# Patient Record
Sex: Male | Born: 1955 | Race: White | Hispanic: No | Marital: Married | Smoking: Former smoker
Health system: Southern US, Community
[De-identification: ages and names within clinical notes are randomized; demographics above are authoritative.]

## PROBLEM LIST (undated history)

## (undated) DIAGNOSIS — I10 Essential (primary) hypertension: Secondary | ICD-10-CM

## (undated) DIAGNOSIS — E119 Type 2 diabetes mellitus without complications: Secondary | ICD-10-CM

## (undated) DIAGNOSIS — N400 Enlarged prostate without lower urinary tract symptoms: Secondary | ICD-10-CM

## (undated) DIAGNOSIS — J189 Pneumonia, unspecified organism: Secondary | ICD-10-CM

## (undated) DIAGNOSIS — G9050 Complex regional pain syndrome I, unspecified: Secondary | ICD-10-CM

## (undated) DIAGNOSIS — K594 Anal spasm: Secondary | ICD-10-CM

## (undated) DIAGNOSIS — G4733 Obstructive sleep apnea (adult) (pediatric): Secondary | ICD-10-CM

## (undated) DIAGNOSIS — I82401 Acute embolism and thrombosis of unspecified deep veins of right lower extremity: Secondary | ICD-10-CM

## (undated) DIAGNOSIS — G5603 Carpal tunnel syndrome, bilateral upper limbs: Secondary | ICD-10-CM

## (undated) DIAGNOSIS — G2581 Restless legs syndrome: Secondary | ICD-10-CM

## (undated) DIAGNOSIS — C61 Malignant neoplasm of prostate: Secondary | ICD-10-CM

## (undated) DIAGNOSIS — G4730 Sleep apnea, unspecified: Secondary | ICD-10-CM

## (undated) DIAGNOSIS — M1990 Unspecified osteoarthritis, unspecified site: Secondary | ICD-10-CM

## (undated) DIAGNOSIS — I82402 Acute embolism and thrombosis of unspecified deep veins of left lower extremity: Secondary | ICD-10-CM

## (undated) DIAGNOSIS — H269 Unspecified cataract: Secondary | ICD-10-CM

## (undated) DIAGNOSIS — E669 Obesity, unspecified: Secondary | ICD-10-CM

## (undated) DIAGNOSIS — I8390 Asymptomatic varicose veins of unspecified lower extremity: Secondary | ICD-10-CM

## (undated) DIAGNOSIS — K219 Gastro-esophageal reflux disease without esophagitis: Secondary | ICD-10-CM

## (undated) DIAGNOSIS — E7800 Pure hypercholesterolemia, unspecified: Secondary | ICD-10-CM

## (undated) HISTORY — PX: ELBOW ARTHROSCOPY: SUR87

## (undated) HISTORY — DX: Unspecified cataract: H26.9

## (undated) HISTORY — DX: Acute embolism and thrombosis of unspecified deep veins of right lower extremity: I82.401

## (undated) HISTORY — DX: Acute embolism and thrombosis of unspecified deep veins of left lower extremity: I82.402

## (undated) HISTORY — DX: Asymptomatic varicose veins of unspecified lower extremity: I83.90

## (undated) HISTORY — PX: NECK SURGERY: SHX720

## (undated) HISTORY — DX: Benign prostatic hyperplasia without lower urinary tract symptoms: N40.0

## (undated) HISTORY — PX: KNEE ARTHROSCOPY: SHX127

## (undated) HISTORY — PX: KNEE ARTHROPLASTY: SHX992

---
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
## 1988-05-03 HISTORY — PX: VASECTOMY: SHX75

## 1995-05-04 HISTORY — PX: ELBOW ARTHROSCOPY: SUR87

## 2000-05-03 HISTORY — PX: ANKLE FRACTURE SURGERY: SHX122

## 2004-06-16 ENCOUNTER — Ambulatory Visit: Payer: Self-pay | Admitting: Pain Medicine

## 2004-06-22 ENCOUNTER — Ambulatory Visit: Payer: Self-pay | Admitting: Pain Medicine

## 2004-07-23 ENCOUNTER — Ambulatory Visit: Payer: Self-pay | Admitting: Pain Medicine

## 2004-08-03 ENCOUNTER — Ambulatory Visit: Payer: Self-pay | Admitting: Pain Medicine

## 2004-09-03 ENCOUNTER — Ambulatory Visit: Payer: Self-pay | Admitting: Pain Medicine

## 2004-09-16 ENCOUNTER — Ambulatory Visit: Payer: Self-pay | Admitting: Pain Medicine

## 2004-10-29 ENCOUNTER — Ambulatory Visit: Payer: Self-pay | Admitting: Pain Medicine

## 2004-11-11 ENCOUNTER — Ambulatory Visit: Payer: Self-pay | Admitting: Pain Medicine

## 2004-12-22 ENCOUNTER — Ambulatory Visit: Payer: Self-pay | Admitting: Pain Medicine

## 2005-01-13 ENCOUNTER — Ambulatory Visit: Payer: Self-pay | Admitting: Pain Medicine

## 2005-02-16 ENCOUNTER — Ambulatory Visit: Payer: Self-pay | Admitting: Pain Medicine

## 2005-02-25 ENCOUNTER — Ambulatory Visit: Payer: Self-pay | Admitting: Unknown Physician Specialty

## 2005-03-11 ENCOUNTER — Ambulatory Visit: Payer: Self-pay | Admitting: Pain Medicine

## 2005-03-17 ENCOUNTER — Ambulatory Visit: Payer: Self-pay | Admitting: Pain Medicine

## 2005-05-18 ENCOUNTER — Ambulatory Visit: Payer: Self-pay | Admitting: Pain Medicine

## 2005-07-08 ENCOUNTER — Ambulatory Visit: Payer: Self-pay | Admitting: Pain Medicine

## 2005-08-10 ENCOUNTER — Ambulatory Visit: Payer: Self-pay | Admitting: Pain Medicine

## 2005-09-07 ENCOUNTER — Ambulatory Visit: Payer: Self-pay | Admitting: Pain Medicine

## 2005-10-05 ENCOUNTER — Ambulatory Visit: Payer: Self-pay | Admitting: Pain Medicine

## 2005-11-02 ENCOUNTER — Ambulatory Visit: Payer: Self-pay | Admitting: Pain Medicine

## 2005-12-02 ENCOUNTER — Ambulatory Visit: Payer: Self-pay | Admitting: Pain Medicine

## 2006-01-25 ENCOUNTER — Ambulatory Visit: Payer: Self-pay | Admitting: Pain Medicine

## 2006-04-19 ENCOUNTER — Ambulatory Visit: Payer: Self-pay | Admitting: Pain Medicine

## 2006-05-04 ENCOUNTER — Ambulatory Visit: Payer: Self-pay | Admitting: Pain Medicine

## 2006-06-09 ENCOUNTER — Ambulatory Visit: Payer: Self-pay | Admitting: Pain Medicine

## 2006-06-15 ENCOUNTER — Ambulatory Visit: Payer: Self-pay | Admitting: Pain Medicine

## 2006-07-12 ENCOUNTER — Ambulatory Visit: Payer: Self-pay | Admitting: Pain Medicine

## 2006-10-06 ENCOUNTER — Ambulatory Visit: Payer: Self-pay | Admitting: Pain Medicine

## 2006-12-29 ENCOUNTER — Ambulatory Visit: Payer: Self-pay | Admitting: Pain Medicine

## 2007-01-11 ENCOUNTER — Ambulatory Visit: Payer: Self-pay | Admitting: Pain Medicine

## 2007-02-15 ENCOUNTER — Ambulatory Visit: Payer: Self-pay | Admitting: Pain Medicine

## 2007-03-23 ENCOUNTER — Ambulatory Visit: Payer: Self-pay | Admitting: Pain Medicine

## 2007-05-03 ENCOUNTER — Other Ambulatory Visit: Payer: Self-pay

## 2007-05-03 ENCOUNTER — Ambulatory Visit: Payer: Self-pay | Admitting: Orthopaedic Surgery

## 2007-05-09 ENCOUNTER — Ambulatory Visit: Payer: Self-pay | Admitting: Orthopaedic Surgery

## 2007-06-15 ENCOUNTER — Ambulatory Visit: Payer: Self-pay | Admitting: Pain Medicine

## 2007-09-07 ENCOUNTER — Ambulatory Visit: Payer: Self-pay | Admitting: Pain Medicine

## 2007-11-30 ENCOUNTER — Ambulatory Visit: Payer: Self-pay | Admitting: Pain Medicine

## 2008-01-31 ENCOUNTER — Ambulatory Visit: Payer: Self-pay | Admitting: Gastroenterology

## 2008-02-15 ENCOUNTER — Ambulatory Visit: Payer: Self-pay | Admitting: Pain Medicine

## 2008-03-06 ENCOUNTER — Ambulatory Visit: Payer: Self-pay | Admitting: Gastroenterology

## 2008-03-06 HISTORY — PX: UPPER GASTROINTESTINAL ENDOSCOPY: SHX188

## 2008-05-16 ENCOUNTER — Ambulatory Visit: Payer: Self-pay | Admitting: Pain Medicine

## 2008-08-08 ENCOUNTER — Ambulatory Visit: Payer: Self-pay | Admitting: Pain Medicine

## 2008-08-12 ENCOUNTER — Ambulatory Visit: Payer: Self-pay | Admitting: Pain Medicine

## 2008-11-05 ENCOUNTER — Ambulatory Visit: Payer: Self-pay | Admitting: Pain Medicine

## 2009-01-01 ENCOUNTER — Ambulatory Visit: Payer: Self-pay | Admitting: Pain Medicine

## 2009-02-06 ENCOUNTER — Ambulatory Visit: Payer: Self-pay | Admitting: Pain Medicine

## 2009-02-12 ENCOUNTER — Ambulatory Visit: Payer: Self-pay | Admitting: Pain Medicine

## 2009-03-11 ENCOUNTER — Ambulatory Visit: Payer: Self-pay | Admitting: Pain Medicine

## 2009-03-19 ENCOUNTER — Ambulatory Visit: Payer: Self-pay | Admitting: Pain Medicine

## 2009-04-29 ENCOUNTER — Ambulatory Visit: Payer: Self-pay | Admitting: Pain Medicine

## 2009-05-07 ENCOUNTER — Ambulatory Visit: Payer: Self-pay | Admitting: Pain Medicine

## 2009-06-05 ENCOUNTER — Ambulatory Visit: Payer: Self-pay | Admitting: Pain Medicine

## 2009-06-11 ENCOUNTER — Ambulatory Visit: Payer: Self-pay | Admitting: Pain Medicine

## 2009-07-03 ENCOUNTER — Ambulatory Visit: Payer: Self-pay | Admitting: Pain Medicine

## 2009-10-29 ENCOUNTER — Ambulatory Visit: Payer: Self-pay

## 2011-04-02 ENCOUNTER — Ambulatory Visit: Payer: Self-pay | Admitting: Physical Medicine and Rehabilitation

## 2011-09-01 HISTORY — PX: ANTERIOR FUSION CERVICAL SPINE: SUR626

## 2012-02-02 ENCOUNTER — Ambulatory Visit: Payer: Self-pay | Admitting: Surgery

## 2012-02-02 LAB — CBC WITH DIFFERENTIAL/PLATELET
Basophil %: 0.3 %
Eosinophil #: 0.1 10*3/uL (ref 0.0–0.7)
Eosinophil %: 0.8 %
HCT: 44.7 % (ref 40.0–52.0)
HGB: 15 g/dL (ref 13.0–18.0)
Lymphocyte #: 1 10*3/uL (ref 1.0–3.6)
MCH: 30.8 pg (ref 26.0–34.0)
MCHC: 33.5 g/dL (ref 32.0–36.0)
MCV: 92 fL (ref 80–100)
Monocyte #: 0.5 x10 3/mm (ref 0.2–1.0)
Neutrophil #: 5.1 10*3/uL (ref 1.4–6.5)
Neutrophil %: 76.4 %
Platelet: 225 10*3/uL (ref 150–440)
RBC: 4.86 10*6/uL (ref 4.40–5.90)
WBC: 6.7 10*3/uL (ref 3.8–10.6)

## 2012-02-02 LAB — BASIC METABOLIC PANEL
Anion Gap: 6 — ABNORMAL LOW (ref 7–16)
BUN: 17 mg/dL (ref 7–18)
Calcium, Total: 8.9 mg/dL (ref 8.5–10.1)
Chloride: 107 mmol/L (ref 98–107)
Co2: 31 mmol/L (ref 21–32)
Creatinine: 0.8 mg/dL (ref 0.60–1.30)
EGFR (African American): >60
Osmolality: 289 (ref 275–301)
Potassium: 4.1 mmol/L (ref 3.5–5.1)

## 2012-02-09 ENCOUNTER — Ambulatory Visit: Payer: Self-pay | Admitting: Surgery

## 2012-02-11 LAB — PATHOLOGY REPORT

## 2012-05-04 ENCOUNTER — Ambulatory Visit: Payer: Self-pay | Admitting: Physical Medicine and Rehabilitation

## 2012-09-20 ENCOUNTER — Ambulatory Visit: Payer: Self-pay | Admitting: Family Medicine

## 2012-10-06 ENCOUNTER — Ambulatory Visit: Payer: Self-pay | Admitting: Family Medicine

## 2013-08-17 DIAGNOSIS — M779 Enthesopathy, unspecified: Secondary | ICD-10-CM | POA: Insufficient documentation

## 2013-08-17 DIAGNOSIS — M1990 Unspecified osteoarthritis, unspecified site: Secondary | ICD-10-CM | POA: Insufficient documentation

## 2013-08-17 DIAGNOSIS — N320 Bladder-neck obstruction: Secondary | ICD-10-CM | POA: Insufficient documentation

## 2013-08-17 DIAGNOSIS — G4700 Insomnia, unspecified: Secondary | ICD-10-CM | POA: Insufficient documentation

## 2013-08-17 DIAGNOSIS — I10 Essential (primary) hypertension: Secondary | ICD-10-CM | POA: Insufficient documentation

## 2013-08-17 DIAGNOSIS — E7800 Pure hypercholesterolemia, unspecified: Secondary | ICD-10-CM | POA: Insufficient documentation

## 2013-10-11 DIAGNOSIS — K219 Gastro-esophageal reflux disease without esophagitis: Secondary | ICD-10-CM | POA: Insufficient documentation

## 2014-01-08 ENCOUNTER — Ambulatory Visit (INDEPENDENT_AMBULATORY_CARE_PROVIDER_SITE_OTHER): Payer: BC Managed Care – PPO | Admitting: Podiatry

## 2014-01-08 ENCOUNTER — Ambulatory Visit (INDEPENDENT_AMBULATORY_CARE_PROVIDER_SITE_OTHER): Payer: BC Managed Care – PPO

## 2014-01-08 ENCOUNTER — Encounter: Payer: Self-pay | Admitting: Podiatry

## 2014-01-08 ENCOUNTER — Ambulatory Visit: Payer: Self-pay | Admitting: Podiatry

## 2014-01-08 VITALS — BP 128/79 | HR 92 | Resp 12

## 2014-01-08 DIAGNOSIS — R52 Pain, unspecified: Secondary | ICD-10-CM

## 2014-01-08 DIAGNOSIS — G5762 Lesion of plantar nerve, left lower limb: Secondary | ICD-10-CM

## 2014-01-08 DIAGNOSIS — G5760 Lesion of plantar nerve, unspecified lower limb: Secondary | ICD-10-CM

## 2014-01-08 DIAGNOSIS — M65979 Unspecified synovitis and tenosynovitis, unspecified ankle and foot: Secondary | ICD-10-CM | POA: Diagnosis not present

## 2014-01-08 DIAGNOSIS — M659 Synovitis and tenosynovitis, unspecified: Secondary | ICD-10-CM | POA: Diagnosis not present

## 2014-01-08 DIAGNOSIS — M779 Enthesopathy, unspecified: Secondary | ICD-10-CM | POA: Diagnosis not present

## 2014-01-08 MED ORDER — TRIAMCINOLONE ACETONIDE 10 MG/ML IJ SUSP: 10.0000 mg | Freq: Once | INTRAMUSCULAR | Status: DC

## 2014-01-08 NOTE — Progress Notes (Signed)
   Subjective:    Patient ID: Ruben [name], male    DOB: 1955-05-13, 58 y.o.   MRN: [id number]  HPI PT STATED TOP OF THE LT FOOT IS SWOLLEN AND SORE FOR 3 WEEKS. THE FOOT IS NOT GETTING ANY BETTER AND WORSE. THE FOOT GET AGGRAVATED BY SITTING/WALKING. TRIED ICE BUT NO HELP.   Review of Systems  All other systems reviewed and are negative.      Objective:   Physical Exam        Assessment & Plan:

## 2014-01-08 NOTE — Progress Notes (Signed)
Subjective:     Patient ID: Ruben [name], male   DOB: 03/13/1956, 58 y.o.   MRN: [id number]  HPI patient presents stating I been getting a lot of pain on top of my left foot for about 3 weeks. I've had off and on pain but not like this has been   Review of Systems     Objective:   Physical Exam Neurovascular status unchanged with patient's health history doing well. Patient is noted to have moderate discomfort around the second third and fourth metatarsal phalangeal joints left and moderate discomfort in the third interspace of the left foot upon palpation    Assessment:     Probable capsulitis with possibility for neuroma symptomatology left    Plan:     H&P and condition and x-rays discussed with patient. Today I did steroidal injection of the second and fourth MPJs 3 mg dexamethasone Kenalog 5 mg Xylocaine and did scanned for custom orthotics to reduce pressure against his feet. Reappoint when they are ready or earlier if there is any issues

## 2014-02-01 ENCOUNTER — Ambulatory Visit (INDEPENDENT_AMBULATORY_CARE_PROVIDER_SITE_OTHER): Payer: BC Managed Care – PPO | Admitting: Podiatry

## 2014-02-01 VITALS — BP 122/78 | HR 90 | Resp 16

## 2014-02-01 DIAGNOSIS — M779 Enthesopathy, unspecified: Secondary | ICD-10-CM

## 2014-02-01 NOTE — Progress Notes (Signed)
Subjective:     Patient ID: Ruben [name], male   DOB: 1955-06-14, 58 y.o.   MRN: [id number]  HPI patient presents stating I'm feeling some better but still noticing discomfort in my forefoot left. Also here to pickup orthotics   Review of Systems     Objective:   Physical Exam Neurovascular status intact with significant reduction of discomfort and swelling in the third metatarsal and fourth metatarsophalangeal joints with toes in good alignment and no significant bruising noted    Assessment:     Improved capsulitis with mild to moderate discomfort still noted    Plan:     Reviewed condition and recommended stretching exercises supportive shoes and new orthotics. If symptoms persist or recur he is to reappoint

## 2014-02-13 DIAGNOSIS — M25559 Pain in unspecified hip: Secondary | ICD-10-CM | POA: Insufficient documentation

## 2014-04-05 DIAGNOSIS — M222X9 Patellofemoral disorders, unspecified knee: Secondary | ICD-10-CM | POA: Insufficient documentation

## 2014-05-21 ENCOUNTER — Ambulatory Visit: Payer: Self-pay | Admitting: General Practice

## 2014-08-16 ENCOUNTER — Ambulatory Visit (INDEPENDENT_AMBULATORY_CARE_PROVIDER_SITE_OTHER): Payer: BLUE CROSS/BLUE SHIELD

## 2014-08-16 ENCOUNTER — Ambulatory Visit (INDEPENDENT_AMBULATORY_CARE_PROVIDER_SITE_OTHER): Payer: BLUE CROSS/BLUE SHIELD | Admitting: Podiatry

## 2014-08-16 ENCOUNTER — Encounter: Payer: Self-pay | Admitting: Podiatry

## 2014-08-16 VITALS — BP 157/90 | HR 65 | Resp 16

## 2014-08-16 DIAGNOSIS — M7662 Achilles tendinitis, left leg: Secondary | ICD-10-CM | POA: Diagnosis not present

## 2014-08-16 MED ORDER — TRIAMCINOLONE ACETONIDE 10 MG/ML IJ SUSP
10.0000 mg | Freq: Once | INTRAMUSCULAR | Status: AC
  Administered 2014-08-16: 10 mg

## 2014-08-16 NOTE — Patient Instructions (Signed)

## 2014-08-19 NOTE — Progress Notes (Signed)
Subjective:     Patient ID: Ruben [name], male   DOB: 10-01-1955, 59 y.o.   MRN: [id number]  HPI patient states I traumatized my left ankle and I wanted to make sure it didn't break anything and it really hurting on the inside around the back of my heel   Review of Systems     Objective:   Physical Exam Neurovascular status intact with no significant health history changes with patient having quite a bit of pain in the lateral aspect of the calcaneus near the insertion of the Achilles tendon with mild bruising noted in the area but no edema noted    Assessment:     Possibility for mild Achilles tendinitis with inflammation versus bone injury    Plan:     X-rays reviewed with patient and today I did a very careful injection medial side explaining first chances for rupture associated with the Achilles tendon and patient is willing to accept risk. I went ahead and utilized 3 mg dexamethasone Kenalog 5 mg Xylocaine and advised on reduced activity and boot usage that he has at home. He will be seen back to recheck in the next 3-4 weeks or earlier if any issues should occur

## 2014-08-20 NOTE — Op Note (Signed)
PATIENT NAME:  Ruben [name], Ruben [name] MR#:  [id number] DATE OF BIRTH:  Jan 30, 1956  DATE OF PROCEDURE:  02/09/2012  PREOPERATIVE DIAGNOSIS: Right axillary mass.   POSTOPERATIVE DIAGNOSIS: Right axillary mass.   PROCEDURE: Excisional biopsy of right axillary mass.   SURGEON:  Dionne Miloichard , M.[name].   ANESTHESIA: General with LMA.  ASSISTANT: Elizebeth BrookingKyle Pusey, PA-S   INDICATIONS: This is a patient with an enlarging painful right axillary mass requiring biopsy. Preoperatively we discussed the rationale for surgery, the options of observation, risks of bleeding, infection, recurrence, cosmetic deformity. He understood and agreed to proceed. This was all reviewed for he and his wife in the preop holding area.   FINDINGS: Fibrofatty tissue normal in nature, nondescript mass. The area involved was approximately 5 cm, excised via an intermediate closed 8- mm incision.   DESCRIPTION OF PROCEDURE: The patient was induced to general anesthesia and placed in a well-padded supine position with the arm extended at the shoulder. He was prepped and draped in a sterile fashion. Marcaine was infiltrated in skin and subcutaneous tissues around the palpable, visible, and previously marked mass. An incision was made and dissection down to fibrofatty tissue was performed. No discrete encapsulated mass was identified and no remnant of mass was identified after removal of a large amount of fibrofatty tissue that appeared normal. There was no palpable mass in the axilla as this was in the posterior axilla and not subfascial.   Once assuring that hemostasis was adequate and there was no residual mass, the wound was closed after placing additional Marcaine with deep sutures of 3-0 Vicryl followed by 4-0 subcuticular Monocryl. Steri-Strips, Mastisol, and sterile dressings were placed.   The patient tolerated the procedure well. There were no complications. He was taken to the recovery room in stable condition to be discharged in the  care of his family. Follow up in 10 days.   ____________________________ Adah Salvageichard E. Excell Seltzerooper, MD rec:bjt [name]: 02/09/2012 08:06:55 ET T: 02/09/2012 09:59:08 ET JOB#: [id number]  cc: Adah Salvageichard E. Excell Seltzerooper, MD, <Dictator> Lattie HawICHARD E  MD ELECTRONICALLY SIGNED 02/09/2012 15:37

## 2014-08-20 NOTE — H&P (Signed)
PATIENT NAME:  Ruben [name], Ruben [name] MR#:  [id number] DATE OF BIRTH:  03-Nov-1955  DATE OF ADMISSION:  02/09/2012  CHIEF COMPLAINT: Right axillary mass.   HISTORY OF PRESENT ILLNESS: This is a patient with a right axillary mass that has been growing and painful. It has been present for at least a year but is growing and painful. He has had no other symptoms. He is here for elective excisional biopsy of a right axillary mass.   PAST MEDICAL HISTORY:  1. Depression. 2. Reflux. 3. Hypertension. 4. Varicose veins.   PAST SURGICAL HISTORY:  1. Neck surgery.  2. Eye surgery. 3. Knee surgery.   MEDICATIONS:  1. Celebrex. 2. Cymbalta. 3. .  4. Lyrica.    ALLERGIES: Ibuprofen, Aleve and Relafen.   SOCIAL HISTORY: Patient drinks alcohol, is employed and is a former smoker stopping many years ago.   FAMILY HISTORY: Noncontributory.   REVIEW OF SYSTEMS: 10 system review has been performed and negative documented in the office chart.   PHYSICAL EXAMINATION:  GENERAL: Healthy male patient.   AXILLA: Right axillary mass is noted measuring 4 x 3 cm. It is nontender, soft and somewhat fatty in the posterior axillary area.   GENERAL: He is a healthy-appearing male patient.   HEENT: No scleral icterus.   NECK: No palpable neck nodes.   CHEST: Clear to auscultation.   CARDIAC: Regular rate and rhythm.   ABDOMEN: Soft, nontender.   EXTREMITIES: Without edema.   NEUROLOGIC: Grossly intact.   INTEGUMENT: No jaundice.   LABORATORY, DIAGNOSTIC AND RADIOLOGICAL DATA: Laboratory values are within normal limits.   ASSESSMENT AND PLAN: This is a patient with a right axillary mass requiring excisional biopsy due to growth and pain. Rationale for this has been discussed with the patient. The options of observation have been reviewed and the risks of bleeding, infection, recurrence, and cosmetic deformity were reviewed. He understood and agreed to proceed.    ____________________________ Adah Salvageichard E. Excell Seltzerooper, MD rec:cms [name]: 02/08/2012 22:10:59 ET T: 02/09/2012 06:57:47 ET JOB#: [id number] cc: Adah Salvageichard E. Excell Seltzerooper, MD, <Dictator> Lattie HawICHARD E COOPER MD ELECTRONICALLY SIGNED 02/09/2012 15:37

## 2014-09-09 ENCOUNTER — Encounter
Admission: RE | Admit: 2014-09-09 | Discharge: 2014-09-09 | Disposition: A | Discharge status: Home / Self Care | Payer: BLUE CROSS/BLUE SHIELD | Source: Ambulatory Visit | Attending: Orthopedic Surgery | Admitting: Orthopedic Surgery

## 2014-09-09 DIAGNOSIS — G5601 Carpal tunnel syndrome, right upper limb: Secondary | ICD-10-CM | POA: Diagnosis not present

## 2014-09-09 DIAGNOSIS — Z01810 Encounter for preprocedural cardiovascular examination: Secondary | ICD-10-CM | POA: Diagnosis present

## 2014-09-09 DIAGNOSIS — K219 Gastro-esophageal reflux disease without esophagitis: Secondary | ICD-10-CM | POA: Insufficient documentation

## 2014-09-09 DIAGNOSIS — Z809 Family history of malignant neoplasm, unspecified: Secondary | ICD-10-CM | POA: Insufficient documentation

## 2014-09-09 DIAGNOSIS — G4733 Obstructive sleep apnea (adult) (pediatric): Secondary | ICD-10-CM | POA: Insufficient documentation

## 2014-09-09 DIAGNOSIS — I10 Essential (primary) hypertension: Secondary | ICD-10-CM | POA: Insufficient documentation

## 2014-09-09 DIAGNOSIS — M1711 Unilateral primary osteoarthritis, right knee: Secondary | ICD-10-CM | POA: Diagnosis present

## 2014-09-09 DIAGNOSIS — Z01812 Encounter for preprocedural laboratory examination: Secondary | ICD-10-CM | POA: Diagnosis present

## 2014-09-09 DIAGNOSIS — E785 Hyperlipidemia, unspecified: Secondary | ICD-10-CM | POA: Diagnosis not present

## 2014-09-09 DIAGNOSIS — Z8249 Family history of ischemic heart disease and other diseases of the circulatory system: Secondary | ICD-10-CM | POA: Insufficient documentation

## 2014-09-09 HISTORY — DX: Essential (primary) hypertension: I10

## 2014-09-09 HISTORY — DX: Unspecified osteoarthritis, unspecified site: M19.90

## 2014-09-09 HISTORY — DX: Sleep apnea, unspecified: G47.30

## 2014-09-09 LAB — POTASSIUM: Potassium: 4 mmol/L (ref 3.5–5.1)

## 2014-09-10 MED ORDER — LACTATED RINGERS IV SOLN: 500.0000 mL | INTRAVENOUS | Status: DC

## 2014-09-16 ENCOUNTER — Encounter: Payer: Self-pay | Admitting: Orthopedic Surgery

## 2014-09-16 ENCOUNTER — Ambulatory Visit
Admission: RE | Admit: 2014-09-16 | Discharge: 2014-09-16 | Disposition: A | Discharge status: Home / Self Care | Payer: BLUE CROSS/BLUE SHIELD | Source: Ambulatory Visit | Attending: Orthopedic Surgery | Admitting: Orthopedic Surgery

## 2014-09-16 ENCOUNTER — Ambulatory Visit: Payer: BLUE CROSS/BLUE SHIELD | Admitting: Anesthesiology

## 2014-09-16 ENCOUNTER — Encounter
Admission: RE | Disposition: A | Discharge status: Home / Self Care | Payer: Self-pay | Source: Ambulatory Visit | Attending: Orthopedic Surgery

## 2014-09-16 DIAGNOSIS — Z87891 Personal history of nicotine dependence: Secondary | ICD-10-CM | POA: Insufficient documentation

## 2014-09-16 DIAGNOSIS — S83281A Other tear of lateral meniscus, current injury, right knee, initial encounter: Secondary | ICD-10-CM | POA: Insufficient documentation

## 2014-09-16 DIAGNOSIS — Z888 Allergy status to other drugs, medicaments and biological substances status: Secondary | ICD-10-CM | POA: Diagnosis not present

## 2014-09-16 DIAGNOSIS — I10 Essential (primary) hypertension: Secondary | ICD-10-CM | POA: Diagnosis not present

## 2014-09-16 DIAGNOSIS — Z886 Allergy status to analgesic agent status: Secondary | ICD-10-CM | POA: Diagnosis not present

## 2014-09-16 DIAGNOSIS — E785 Hyperlipidemia, unspecified: Secondary | ICD-10-CM | POA: Diagnosis not present

## 2014-09-16 DIAGNOSIS — Z8379 Family history of other diseases of the digestive system: Secondary | ICD-10-CM | POA: Insufficient documentation

## 2014-09-16 DIAGNOSIS — Z9889 Other specified postprocedural states: Secondary | ICD-10-CM | POA: Insufficient documentation

## 2014-09-16 DIAGNOSIS — Z79899 Other long term (current) drug therapy: Secondary | ICD-10-CM | POA: Diagnosis not present

## 2014-09-16 DIAGNOSIS — Z8249 Family history of ischemic heart disease and other diseases of the circulatory system: Secondary | ICD-10-CM | POA: Insufficient documentation

## 2014-09-16 DIAGNOSIS — Z9852 Vasectomy status: Secondary | ICD-10-CM | POA: Diagnosis not present

## 2014-09-16 DIAGNOSIS — M1990 Unspecified osteoarthritis, unspecified site: Secondary | ICD-10-CM | POA: Insufficient documentation

## 2014-09-16 DIAGNOSIS — Z809 Family history of malignant neoplasm, unspecified: Secondary | ICD-10-CM | POA: Diagnosis not present

## 2014-09-16 DIAGNOSIS — X58XXXA Exposure to other specified factors, initial encounter: Secondary | ICD-10-CM | POA: Diagnosis not present

## 2014-09-16 DIAGNOSIS — K219 Gastro-esophageal reflux disease without esophagitis: Secondary | ICD-10-CM | POA: Insufficient documentation

## 2014-09-16 DIAGNOSIS — G4730 Sleep apnea, unspecified: Secondary | ICD-10-CM | POA: Insufficient documentation

## 2014-09-16 DIAGNOSIS — Z791 Long term (current) use of non-steroidal anti-inflammatories (NSAID): Secondary | ICD-10-CM | POA: Diagnosis not present

## 2014-09-16 DIAGNOSIS — M2391 Unspecified internal derangement of right knee: Secondary | ICD-10-CM | POA: Diagnosis present

## 2014-09-16 HISTORY — PX: KNEE ARTHROSCOPY: SHX127

## 2014-09-16 SURGERY — ARTHROSCOPY, KNEE
Anesthesia: General | Laterality: Right

## 2014-09-16 MED ORDER — FENTANYL CITRATE (PF) 100 MCG/2ML IJ SOLN
INTRAMUSCULAR | Status: DC | PRN
  Administered 2014-09-16: 50 ug via INTRAVENOUS

## 2014-09-16 MED ORDER — FENTANYL CITRATE (PF) 100 MCG/2ML IJ SOLN
INTRAMUSCULAR | Status: AC
  Administered 2014-09-16: 25 ug via INTRAVENOUS
  Filled 2014-09-16: qty 2

## 2014-09-16 MED ORDER — BUPIVACAINE-EPINEPHRINE (PF) 0.25% -1:200000 IJ SOLN
INTRAMUSCULAR | Status: AC
  Filled 2014-09-16: qty 30

## 2014-09-16 MED ORDER — ACETAMINOPHEN 10 MG/ML IV SOLN: INTRAVENOUS | Status: DC | PRN

## 2014-09-16 MED ORDER — LACTATED RINGERS IR SOLN
Status: DC | PRN
  Administered 2014-09-16: 15000 mL

## 2014-09-16 MED ORDER — CHLORHEXIDINE GLUCONATE 4 % EX LIQD: 60.0000 mL | Freq: Once | CUTANEOUS | Status: DC

## 2014-09-16 MED ORDER — CEFAZOLIN SODIUM-DEXTROSE 2-3 GM-% IV SOLR: 2.0000 g | INTRAVENOUS | Status: DC

## 2014-09-16 MED ORDER — SODIUM CHLORIDE 0.9 % IV SOLN
10000.0000 ug | INTRAVENOUS | Status: DC | PRN
  Administered 2014-09-16: 100 ug via INTRAVENOUS

## 2014-09-16 MED ORDER — ONDANSETRON HCL 4 MG/2ML IJ SOLN
INTRAMUSCULAR | Status: DC | PRN
  Administered 2014-09-16: 4 mg via INTRAVENOUS

## 2014-09-16 MED ORDER — FENTANYL CITRATE (PF) 100 MCG/2ML IJ SOLN
25.0000 ug | INTRAMUSCULAR | Status: DC | PRN
  Administered 2014-09-16 (×4): 25 ug via INTRAVENOUS

## 2014-09-16 MED ORDER — BUPIVACAINE-EPINEPHRINE (PF) 0.25% -1:200000 IJ SOLN
INTRAMUSCULAR | Status: DC | PRN
  Administered 2014-09-16: 25 mL
  Administered 2014-09-16: 5 mL

## 2014-09-16 MED ORDER — MORPHINE SULFATE 4 MG/ML IJ SOLN
INTRAMUSCULAR | Status: DC | PRN
  Administered 2014-09-16: 4 mg

## 2014-09-16 MED ORDER — MORPHINE SULFATE 4 MG/ML IJ SOLN
INTRAMUSCULAR | Status: AC
  Filled 2014-09-16: qty 1

## 2014-09-16 MED ORDER — ONDANSETRON HCL 4 MG/2ML IJ SOLN: 4.0000 mg | Freq: Once | INTRAMUSCULAR | Status: DC | PRN

## 2014-09-16 MED ORDER — LACTATED RINGERS IV SOLN
INTRAVENOUS | Status: DC
  Administered 2014-09-16 (×2): via INTRAVENOUS

## 2014-09-16 MED ORDER — CEFAZOLIN SODIUM-DEXTROSE 2-3 GM-% IV SOLR
INTRAVENOUS | Status: AC
  Administered 2014-09-16: 2 g via INTRAVENOUS
  Filled 2014-09-16: qty 50

## 2014-09-16 MED ORDER — EPHEDRINE SULFATE 50 MG/ML IJ SOLN
INTRAMUSCULAR | Status: DC | PRN
Start: 2014-09-16 — End: 2014-09-16
  Administered 2014-09-16 (×2): 10 mg via INTRAVENOUS

## 2014-09-16 MED ORDER — ACETAMINOPHEN 10 MG/ML IV SOLN
INTRAVENOUS | Status: DC | PRN
  Administered 2014-09-16: 1000 mg via INTRAVENOUS

## 2014-09-16 MED ORDER — PROPOFOL 10 MG/ML IV BOLUS
INTRAVENOUS | Status: DC | PRN
  Administered 2014-09-16: 150 mg via INTRAVENOUS

## 2014-09-16 MED ORDER — MIDAZOLAM HCL 2 MG/2ML IJ SOLN
INTRAMUSCULAR | Status: DC | PRN
  Administered 2014-09-16: 2 mg via INTRAVENOUS

## 2014-09-16 MED ORDER — LIDOCAINE HCL (CARDIAC) 20 MG/ML IV SOLN
INTRAVENOUS | Status: DC | PRN
  Administered 2014-09-16: 100 mg via INTRAVENOUS

## 2014-09-16 MED ORDER — ACETAMINOPHEN 10 MG/ML IV SOLN
INTRAVENOUS | Status: AC
  Filled 2014-09-16: qty 100

## 2014-09-16 SURGICAL SUPPLY — 25 items
BLADE SHAVER 4.5 DBL SERAT CV (CUTTER) ×3 IMPLANT
BNDG ESMARK 6X12 TAN STRL LF (GAUZE/BANDAGES/DRESSINGS) ×3 IMPLANT
DRSG DERMACEA 8X12 NADH (GAUZE/BANDAGES/DRESSINGS) ×3 IMPLANT
DURAPREP 26ML APPLICATOR (WOUND CARE) ×6 IMPLANT
GAUZE SPONGE 4X4 12PLY STRL (GAUZE/BANDAGES/DRESSINGS) ×3 IMPLANT
GLOVE BIOGEL M STRL SZ7.5 (GLOVE) ×3 IMPLANT
GLOVE INDICATOR 8.0 STRL GRN (GLOVE) ×3 IMPLANT
GOWN STRL REUS W/ TWL LRG LVL3 (GOWN DISPOSABLE) ×1 IMPLANT
GOWN STRL REUS W/TWL LRG LVL3 (GOWN DISPOSABLE) ×2
GOWN STRL REUS W/TWL XL LVL4 (GOWN DISPOSABLE) ×3 IMPLANT
IV LACTATED RINGER IRRG 3000ML (IV SOLUTION) ×10
IV LR IRRIG 3000ML ARTHROMATIC (IV SOLUTION) ×5 IMPLANT
MANIFOLD NEPTUNE II (INSTRUMENTS) ×3 IMPLANT
PACK ARTHROSCOPY KNEE (MISCELLANEOUS) ×3 IMPLANT
PAD CAST CTTN 4X4 STRL (SOFTGOODS) ×2 IMPLANT
PADDING CAST COTTON 4X4 STRL (SOFTGOODS) ×4
SET TUBE SUCT SHAVER OUTFL 24K (TUBING) ×3 IMPLANT
SET TUBE TIP INTRA-ARTICULAR (MISCELLANEOUS) ×3 IMPLANT
STOCKINETTE BIAS CUT 6 980064 (GAUZE/BANDAGES/DRESSINGS) ×3 IMPLANT
STRAP SAFETY BODY (MISCELLANEOUS) ×3 IMPLANT
SUT ETHILON 3-0 FS-10 30 BLK (SUTURE) ×3
SUTURE EHLN 3-0 FS-10 30 BLK (SUTURE) ×1 IMPLANT
TUBING ARTHRO INFLOW-ONLY STRL (TUBING) ×3 IMPLANT
WAND HAND CNTRL MULTIVAC 50 (MISCELLANEOUS) ×3 IMPLANT
WRAP KNEE W/COLD PACKS 25.5X14 (SOFTGOODS) ×3 IMPLANT

## 2014-09-16 NOTE — Op Note (Signed)
DATE OF SURGERY:  09/16/2014  PATIENT NAME:  Ruben [name]   DOB: 1956/03/19  MRN: [id number]  PRE-OPERATIVE DIAGNOSIS:  Internal derangement of the right knee   POST-OPERATIVE DIAGNOSIS:  Right knee tear of the posterior horn of the medial meniscus and radial tear of the lateral meniscus  PROCEDURE:  Right knee arthroscopy, partial medial and lateral meniscectomies  SURGEON:  Jena Gauss P , Jr., M.D.   ASSISTANT: none  ANESTHESIA: general  ESTIMATED BLOOD LOSS: Minimal  FLUIDS REPLACED: 500 mL of crystalloid  TOURNIQUET TIME: Not used   DRAINS: none  IMPLANTS UTILIZED: None  INDICATIONS FOR SURGERY: Ruben [name] is a 59 y.o. year old male who has been seen for complaints of right knee pain. MRI demonstrated findings consistent with meniscal pathology. After discussion of the risks and benefits of surgical intervention, the patient expressed understanding of the risks benefits and agree with plans for right knee arthroscopy.   PROCEDURE IN DETAIL: The patient was brought into the operating room and, after adequate general anesthesia was achieved, a tourniquet was applied to the right thigh and the leg was placed in the leg holder. All bony prominences were well padded. The patient's right knee was cleaned and prepped with alcohol and Duraprep and draped in the usual sterile fashion. A "timeout" was performed as per usual protocol. The anticipated portal sites were injected with 0.25% Marcaine with epinephrine. An anterolateral incision was made and a cannula was inserted. A small effusion was evacuated and the knee was distended with fluid using the pump. The scope was advanced down the medial gutter into the medial compartment. Under visualization with the scope, an anteromedial portal was created and a hooked probe was inserted. The medial meniscus was visualized and probed. There was a complex tear of the posterior horn of the medial meniscus with a horizontal cleavage  component as well as a flap lesion. The tear was debrided using meniscal punches and 4.5 mm shaver. Final contouring was performed using the 50 ArthroCare wand. The articular cartilage was visualized and felt to be in good condition  The scope was then advanced into the intercondylar notch. The anterior cruciate ligament was visualized and probed and felt to be intact. The scope was removed from the lateral portal and reinserted via the anteromedial portal to better visualize the lateral compartment. The lateral meniscus was visualized and probed. There was a small radial tear on the lateral aspect of the meniscus. The tear was debrided using combination of meniscal punches and the 4.5 mm shaver. Final contouring was performed using the ArthroCare wand. The articular cartilage of the lateral compartment was visualized and felt to be in good condition. Finally, the scope was advanced so as to visualize the patellofemoral articulation. Good patellar tracking was appreciated. The articular surface was in good condition.  The knee was irrigated with copius amounts of fluid and suctioned dry. The anterolateral portal was re-approximated with #3-0 nylon. A combination of 0.25% Marcaine with epinephrine and 4 mg of Morphine were injected via the scope. The scope was removed and the anteromedial portal was re-approximated with #3-0 nylon. A sterile dressing was applied followed by application of an ice wrap.  The patient tolerated the procedure well and was transported to the PACU in stable condition.   P. Angie Fava, Jr., M.D.

## 2014-09-16 NOTE — Anesthesia Postprocedure Evaluation (Signed)
  Anesthesia Post-op Note  Patient: Ruben [name]  Procedure(s) Performed: Procedure(s): ARTHROSCOPY KNEE-partial menisectomy (Right)  Anesthesia type:General  Patient location: PACU  Post pain: Pain level controlled  Post assessment: Post-op Vital signs reviewed, Patient's Cardiovascular Status Stable, Respiratory Function Stable, Patent Airway and No signs of Nausea or vomiting  Post vital signs: Reviewed and stable  Last Vitals:  Filed Vitals:   09/16/14 1344  BP: 122/84  Pulse: 58  Temp: 36.7 C  Resp: 18    Level of consciousness: awake, alert  and patient cooperative  Complications: No apparent anesthesia complications

## 2014-09-16 NOTE — Anesthesia Procedure Notes (Signed)
Procedure Name: LMA Insertion Date/Time: 09/16/2014 11:34 AM Performed by: Jannet MantisPACE,  Pre-anesthesia Checklist: Patient identified, Emergency Drugs available, Suction available and Patient being monitored Patient Re-evaluated:Patient Re-evaluated prior to inductionOxygen Delivery Method: Circle system utilized Preoxygenation: Pre-oxygenation with 100% oxygen Intubation Type: IV induction Ventilation: Mask ventilation without difficulty LMA: LMA inserted LMA Size: 4.0 Number of attempts: 1

## 2014-09-16 NOTE — Anesthesia Preprocedure Evaluation (Addendum)
Anesthesia Evaluation  Patient identified by MRN, date of birth, ID band Patient awake    Reviewed: Allergy & Precautions, NPO status , Patient's Chart, lab work & pertinent test results, reviewed documented beta blocker date and time   Airway Mallampati: III  TM Distance: >3 FB Neck ROM: Full    Dental  (+) Chipped   Pulmonary sleep apnea ,          Cardiovascular hypertension,     Neuro/Psych    GI/Hepatic   Endo/Other  Morbid obesity  Renal/GU      Musculoskeletal  (+) Arthritis -, Osteoarthritis,    Abdominal   Peds  Hematology   Anesthesia Other Findings   Reproductive/Obstetrics                           Anesthesia Physical Anesthesia Plan  ASA: III  Anesthesia Plan: General   Post-op Pain Management:    Induction: Intravenous  Airway Management Planned: LMA  Additional Equipment:   Intra-op Plan:   Post-operative Plan:   Informed Consent: I have reviewed the patients History and Physical, chart, labs and discussed the procedure including the risks, benefits and alternatives for the proposed anesthesia with the patient or authorized representative who has indicated his/her understanding and acceptance.     Plan Discussed with: CRNA  Anesthesia Plan Comments:         Anesthesia Quick Evaluation

## 2014-09-16 NOTE — Brief Op Note (Signed)
09/16/2014  1:09 PM  PATIENT:  Ruben [name]  59 y.o. male  PRE-OPERATIVE DIAGNOSIS:  INTERNAL DERANGEMENT - right knee  POST-OPERATIVE DIAGNOSIS: Right knee - tear posterior horn medial mensicus, radial tear lateral mensicus  PROCEDURE:  Right knee arthroscopy, partial medial & lateral meniscectomies  SURGEON:  Surgeon(s) and Role:    * Donato HeinzJames P Hooten, MD - Primary  ASSISTANTS: none   ANESTHESIA:   general  EBL:  Total I/O In: 500 [I.V.:500] Out: 5 [Blood:5]  BLOOD ADMINISTERED:none  DRAINS: none   LOCAL MEDICATIONS USED:  MARCAINE     SPECIMEN:  No Specimen  DISPOSITION OF SPECIMEN:  [name]  COUNTS:  YES  TOURNIQUET:  Not used  DICTATION: .Dragon Dictation  PLAN OF CARE: Discharge to home after PACU  PATIENT DISPOSITION:  PACU - hemodynamically stable.   Delay start of Pharmacological VTE agent (>24hrs) due to surgical blood loss or risk of bleeding: not applicable

## 2014-09-16 NOTE — H&P (Signed)
The patient has been re-examined, and the chart reviewed, and there have been no interval changes to the documented history and physical.    The risks, benefits, and alternatives have been discussed at length, and the patient is willing to proceed.   

## 2014-09-16 NOTE — Transfer of Care (Signed)
Immediate Anesthesia Transfer of Care Note  Patient: Ruben [name]  Procedure(s) Performed: Procedure(s): ARTHROSCOPY KNEE-partial menisectomy (Right)  Patient Location: PACU  Anesthesia Type:General  Level of Consciousness: awake  Airway & Oxygen Therapy: Patient Spontanous Breathing  Post-op Assessment: Report given to RN  Post vital signs: stable Past Medical History  Diagnosis Date  . Sleep apnea   . Hypertension   . Arthritis    Past Surgical History  Procedure Laterality Date  . Back surgery      neck fusion  . Knee arthroplasty Left   . Elbow arthroscopy Bilateral    Allergies  Allergen Reactions  . Aleve [Naproxen]   . Ibuprofen   . Lodine [Etodolac]     Last Vitals:  Filed Vitals:   09/16/14 1244  BP: 123/80  Pulse: 69  Temp: 36.2 C  Resp: 16    Complications: No apparent anesthesia complications

## 2014-09-17 ENCOUNTER — Encounter: Payer: Self-pay | Admitting: Orthopedic Surgery

## 2014-09-24 DIAGNOSIS — Z9889 Other specified postprocedural states: Secondary | ICD-10-CM | POA: Insufficient documentation

## 2014-10-02 DIAGNOSIS — I82402 Acute embolism and thrombosis of unspecified deep veins of left lower extremity: Secondary | ICD-10-CM

## 2014-10-02 HISTORY — DX: Acute embolism and thrombosis of unspecified deep veins of left lower extremity: I82.402

## 2014-10-03 ENCOUNTER — Ambulatory Visit
Admission: RE | Admit: 2014-10-03 | Discharge: 2014-10-03 | Disposition: A | Discharge status: Home / Self Care | Payer: BLUE CROSS/BLUE SHIELD | Source: Ambulatory Visit | Attending: Nurse Practitioner | Admitting: Nurse Practitioner

## 2014-10-03 ENCOUNTER — Other Ambulatory Visit: Payer: Self-pay | Admitting: Nurse Practitioner

## 2014-10-03 DIAGNOSIS — I82412 Acute embolism and thrombosis of left femoral vein: Secondary | ICD-10-CM | POA: Insufficient documentation

## 2014-10-03 DIAGNOSIS — M7989 Other specified soft tissue disorders: Secondary | ICD-10-CM | POA: Diagnosis present

## 2014-10-03 DIAGNOSIS — I82409 Acute embolism and thrombosis of unspecified deep veins of unspecified lower extremity: Secondary | ICD-10-CM | POA: Insufficient documentation

## 2014-12-03 ENCOUNTER — Ambulatory Visit (INDEPENDENT_AMBULATORY_CARE_PROVIDER_SITE_OTHER): Payer: BLUE CROSS/BLUE SHIELD

## 2014-12-03 ENCOUNTER — Ambulatory Visit (INDEPENDENT_AMBULATORY_CARE_PROVIDER_SITE_OTHER): Payer: BLUE CROSS/BLUE SHIELD | Admitting: Podiatry

## 2014-12-03 DIAGNOSIS — M7662 Achilles tendinitis, left leg: Secondary | ICD-10-CM | POA: Diagnosis not present

## 2014-12-03 DIAGNOSIS — M79672 Pain in left foot: Secondary | ICD-10-CM | POA: Diagnosis not present

## 2014-12-03 NOTE — Progress Notes (Signed)
Patient ID: Ruben [name], male   DOB: July 11, 1955, 59 y.o.   MRN: [id number]  Subjective: 59 year old male presents the office today for recurrence of pain along the Achilles tendon to the left side. He states he has pain in the morning or after periods of inactivity which is relieved by ambulation somewhat. He states this feels like the did previously when he was last in the office. 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 He is currently on Coumadin. Since the DVT has had swelling the left leg. No other complaints this time.  Objective: AAO x3, NAD DP/PT pulses palpable bilaterally, CRT less than 3 seconds Protective sensation intact with Simms Weinstein monofilament, vibratory sensation intact, Achilles tendon reflex intact Mild to palpation along the posterior aspect of the calcaneus on the insertion of the Achilles tendon. There is no pain along the course of the Achilles tendon intact and has is performed which was negative. No defect is noted. There is no pain on the plantar aspect of the calcaneus on the course/insertion of the plantar fascia. There is no pain with lateral compression of the calcaneus. No other areas of tenderness to bilateral lower extremities. MMT 5/5, ROM WNL.  No open lesions or pre-ulcerative lesions.  No overlying edema, erythema, increase in warmth to bilateral lower extremities.  No pain with calf compression, warmth, erythema bilaterally.  There is edema to the left leg compared to contralateral extremity. This is a apparently not new since he was diagnosed with a DVT.  Assessment: 59 year old male with recurrence of Achilles tendinitis left leg  Plan: -X-rays were obtained and reviewed with the patient.  -Treatment options discussed including all alternatives, risks, and complications -Discussed stretching exercises for performed and a daily basis -Ice to the area -Dispensed night splint -Added  a heel lift to the orthotic. As his pain subsides he can take that he'll left off. I discussed with him not to leave the heel lift in permanently. -We'll hold off on anti-inflammatories given Coumadin. Increase he had an injection to the area.  -Follow-up 4 weeks or sooner if any problems arise. In the meantime, encouraged to call the office with any questions, concerns, change in symptoms.   Ovid Curd, DPM

## 2014-12-03 NOTE — Patient Instructions (Signed)
Achilles Tendinitis   with Rehab  Achilles tendinitis is a disorder of the Achilles tendon. The Achilles tendon connects the large calf muscles (Gastrocnemius and Soleus) to the heel bone (calcaneus). This tendon is sometimes called the heel cord. It is important for pushing-off and standing on your toes and is important for walking, running, or jumping. Tendinitis is often caused by overuse and repetitive microtrauma.  SYMPTOMS  · Pain, tenderness, swelling, warmth, and redness may occur over the Achilles tendon even at rest.  · Pain with pushing off, or flexing or extending the ankle.  · Pain that is worsened after or during activity.  CAUSES   · Overuse sometimes seen with rapid increase in exercise programs or in sports requiring running and jumping.  · Poor physical conditioning (strength and flexibility or endurance).  · Running sports, especially training running down hills.  · Inadequate warm-up before practice or play or failure to stretch before participation.  · Injury to the tendon.  PREVENTION   · Warm up and stretch before practice or competition.  · Allow time for adequate rest and recovery between practices and competition.  · Keep up conditioning.  ¨ Keep up ankle and leg flexibility.  ¨ Improve or keep muscle strength and endurance.  ¨ Improve cardiovascular fitness.  · Use proper technique.  · Use proper equipment (shoes, skates).  · To help prevent recurrence, taping, protective strapping, or an adhesive bandage may be recommended for several weeks after healing is complete.  PROGNOSIS   · Recovery may take weeks to several months to heal.  · Longer recovery is expected if symptoms have been prolonged.  · Recovery is usually quicker if the inflammation is due to a direct blow as compared with overuse or sudden strain.  RELATED COMPLICATIONS   · Healing time will be prolonged if the condition is not correctly treated. The injury must be given plenty of time to heal.  · Symptoms can reoccur if  activity is resumed too soon.  · Untreated, tendinitis may increase the risk of tendon rupture requiring additional time for recovery and possibly surgery.  TREATMENT   · The first treatment consists of rest anti-inflammatory medication, and ice to relieve the pain.  · Stretching and strengthening exercises after resolution of pain will likely help reduce the risk of recurrence. Referral to a physical therapist or athletic trainer for further evaluation and treatment may be helpful.  · A walking boot or cast may be recommended to rest the Achilles tendon. This can help break the cycle of inflammation and microtrauma.  · Arch supports (orthotics) may be prescribed or recommended by your caregiver as an adjunct to therapy and rest.  · Surgery to remove the inflamed tendon lining or degenerated tendon tissue is rarely necessary and has shown less than predictable results.  MEDICATION   · Nonsteroidal anti-inflammatory medications, such as aspirin and ibuprofen, may be used for pain and inflammation relief. Do not take within 7 days before surgery. Take these as directed by your caregiver. Contact your caregiver immediately if any bleeding, stomach upset, or signs of allergic reaction occur. Other minor pain relievers, such as acetaminophen, may also be used.  · Pain relievers may be prescribed as necessary by your caregiver. Do not take prescription pain medication for longer than 4 to 7 days. Use only as directed and only as much as you need.  · Cortisone injections are rarely indicated. Cortisone injections may weaken tendons and predispose to rupture. It is better   to give the condition more time to heal than to use them.  HEAT AND COLD  · Cold is used to relieve pain and reduce inflammation for acute and chronic Achilles tendinitis. Cold should be applied for 10 to 15 minutes every 2 to 3 hours for inflammation and pain and immediately after any activity that aggravates your symptoms. Use ice packs or an ice  massage.  · Heat may be used before performing stretching and strengthening activities prescribed by your caregiver. Use a heat pack or a warm soak.  SEEK MEDICAL CARE IF:  · Symptoms get worse or do not improve in 2 weeks despite treatment.  · New, unexplained symptoms develop. Drugs used in treatment may produce side effects.  EXERCISES  RANGE OF MOTION (ROM) AND STRETCHING EXERCISES - Achilles Tendinitis   These exercises may help you when beginning to rehabilitate your injury. Your symptoms may resolve with or without further involvement from your physician, physical therapist or athletic trainer. While completing these exercises, remember:   · Restoring tissue flexibility helps normal motion to return to the joints. This allows healthier, less painful movement and activity.  · An effective stretch should be held for at least 30 seconds.  · A stretch should never be painful. You should only feel a gentle lengthening or release in the stretched tissue.  STRETCH - Gastroc, Standing   · Place hands on wall.  · Extend right / left leg, keeping the front knee somewhat bent.  · Slightly point your toes inward on your back foot.  · Keeping your right / left heel on the floor and your knee straight, shift your weight toward the wall, not allowing your back to arch.  · You should feel a gentle stretch in the right / left calf. Hold this position for __________ seconds.  Repeat __________ times. Complete this stretch __________ times per day.  STRETCH - Soleus, Standing   · Place hands on wall.  · Extend right / left leg, keeping the other knee somewhat bent.  · Slightly point your toes inward on your back foot.  · Keep your right / left heel on the floor, bend your back knee, and slightly shift your weight over the back leg so that you feel a gentle stretch deep in your back calf.  · Hold this position for __________ seconds.  Repeat __________ times. Complete this stretch __________ times per day.  STRETCH -  Gastrocsoleus, Standing   Note: This exercise can place a lot of stress on your foot and ankle. Please complete this exercise only if specifically instructed by your caregiver.   · Place the ball of your right / left foot on a step, keeping your other foot firmly on the same step.  · Hold on to the wall or a rail for balance.  · Slowly lift your other foot, allowing your body weight to press your heel down over the edge of the step.  · You should feel a stretch in your right / left calf.  · Hold this position for __________ seconds.  · Repeat this exercise with a slight bend in your knee.  Repeat __________ times. Complete this stretch __________ times per day.   STRENGTHENING EXERCISES - Achilles Tendinitis  These exercises may help you when beginning to rehabilitate your injury. They may resolve your symptoms with or without further involvement from your physician, physical therapist or athletic trainer. While completing these exercises, remember:   · Muscles can gain both the endurance   and the strength needed for everyday activities through controlled exercises.  · Complete these exercises as instructed by your physician, physical therapist or athletic trainer. Progress the resistance and repetitions only as guided.  · You may experience muscle soreness or fatigue, but the pain or discomfort you are trying to eliminate should never worsen during these exercises. If this pain does worsen, stop and make certain you are following the directions exactly. If the pain is still present after adjustments, discontinue the exercise until you can discuss the trouble with your clinician.  STRENGTH - Plantar-flexors   · Sit with your right / left leg extended. Holding onto both ends of a rubber exercise band/tubing, loop it around the ball of your foot. Keep a slight tension in the band.  · Slowly push your toes away from you, pointing them downward.  · Hold this position for __________ seconds. Return slowly, controlling the  tension in the band/tubing.  Repeat __________ times. Complete this exercise __________ times per day.   STRENGTH - Plantar-flexors   · Stand with your feet shoulder width apart. Steady yourself with a wall or table using as little support as needed.  · Keeping your weight evenly spread over the width of your feet, rise up on your toes.*  · Hold this position for __________ seconds.  Repeat __________ times. Complete this exercise __________ times per day.   *If this is too easy, shift your weight toward your right / left leg until you feel challenged. Ultimately, you may be asked to do this exercise with your right / left foot only.  STRENGTH - Plantar-flexors, Eccentric   Note: This exercise can place a lot of stress on your foot and ankle. Please complete this exercise only if specifically instructed by your caregiver.   · Place the balls of your feet on a step. With your hands, use only enough support from a wall or rail to keep your balance.  · Keep your knees straight and rise up on your toes.  · Slowly shift your weight entirely to your right / left toes and pick up your opposite foot. Gently and with controlled movement, lower your weight through your right / left foot so that your heel drops below the level of the step. You will feel a slight stretch in the back of your calf at the end position.  · Use the healthy leg to help rise up onto the balls of both feet, then lower weight only on the right / left leg again. Build up to 15 repetitions. Then progress to 3 consecutive sets of 15 repetitions.*  · After completing the above exercise, complete the same exercise with a slight knee bend (about 30 degrees). Again, build up to 15 repetitions. Then progress to 3 consecutive sets of 15 repetitions.*  Perform this exercise __________ times per day.   *When you easily complete 3 sets of 15, your physician, physical therapist or athletic trainer may advise you to add resistance by wearing a backpack filled with  additional weight.  STRENGTH - Plantar Flexors, Seated   · Sit on a chair that allows your feet to rest flat on the ground. If necessary, sit at the edge of the chair.  · Keeping your toes firmly on the ground, lift your right / left heel as far as you can without increasing any discomfort in your ankle.  Repeat __________ times. Complete this exercise __________ times a day.  *If instructed by your physician, physical therapist or athletic   trainer, you may add ____________________ of resistance by placing a weighted object on your right / left knee.  Document Released: 11/18/2004 Document Revised: 07/12/2011 Document Reviewed: 08/01/2008  Exit[employer]® Patient Information ©2015 [employer]. This information is not intended to replace advice given to you by your health care provider. Make sure you discuss any questions you have with your health care provider.

## 2014-12-31 ENCOUNTER — Ambulatory Visit: Payer: Self-pay | Admitting: Podiatry

## 2015-01-02 ENCOUNTER — Ambulatory Visit (INDEPENDENT_AMBULATORY_CARE_PROVIDER_SITE_OTHER): Payer: BLUE CROSS/BLUE SHIELD | Admitting: Podiatry

## 2015-01-02 ENCOUNTER — Encounter: Payer: Self-pay | Admitting: Podiatry

## 2015-01-02 VITALS — BP 161/86 | HR 57 | Resp 18

## 2015-01-02 DIAGNOSIS — M7662 Achilles tendinitis, left leg: Secondary | ICD-10-CM

## 2015-01-02 DIAGNOSIS — I82401 Acute embolism and thrombosis of unspecified deep veins of right lower extremity: Secondary | ICD-10-CM

## 2015-01-02 HISTORY — DX: Acute embolism and thrombosis of unspecified deep veins of right lower extremity: I82.401

## 2015-01-02 NOTE — Patient Instructions (Signed)
Achilles Tendinitis   with Rehab  Achilles tendinitis is a disorder of the Achilles tendon. The Achilles tendon connects the large calf muscles (Gastrocnemius and Soleus) to the heel bone (calcaneus). This tendon is sometimes called the heel cord. It is important for pushing-off and standing on your toes and is important for walking, running, or jumping. Tendinitis is often caused by overuse and repetitive microtrauma.  SYMPTOMS  · Pain, tenderness, swelling, warmth, and redness may occur over the Achilles tendon even at rest.  · Pain with pushing off, or flexing or extending the ankle.  · Pain that is worsened after or during activity.  CAUSES   · Overuse sometimes seen with rapid increase in exercise programs or in sports requiring running and jumping.  · Poor physical conditioning (strength and flexibility or endurance).  · Running sports, especially training running down hills.  · Inadequate warm-up before practice or play or failure to stretch before participation.  · Injury to the tendon.  PREVENTION   · Warm up and stretch before practice or competition.  · Allow time for adequate rest and recovery between practices and competition.  · Keep up conditioning.  ¨ Keep up ankle and leg flexibility.  ¨ Improve or keep muscle strength and endurance.  ¨ Improve cardiovascular fitness.  · Use proper technique.  · Use proper equipment (shoes, skates).  · To help prevent recurrence, taping, protective strapping, or an adhesive bandage may be recommended for several weeks after healing is complete.  PROGNOSIS   · Recovery may take weeks to several months to heal.  · Longer recovery is expected if symptoms have been prolonged.  · Recovery is usually quicker if the inflammation is due to a direct blow as compared with overuse or sudden strain.  RELATED COMPLICATIONS   · Healing time will be prolonged if the condition is not correctly treated. The injury must be given plenty of time to heal.  · Symptoms can reoccur if  activity is resumed too soon.  · Untreated, tendinitis may increase the risk of tendon rupture requiring additional time for recovery and possibly surgery.  TREATMENT   · The first treatment consists of rest anti-inflammatory medication, and ice to relieve the pain.  · Stretching and strengthening exercises after resolution of pain will likely help reduce the risk of recurrence. Referral to a physical therapist or athletic trainer for further evaluation and treatment may be helpful.  · A walking boot or cast may be recommended to rest the Achilles tendon. This can help break the cycle of inflammation and microtrauma.  · Arch supports (orthotics) may be prescribed or recommended by your caregiver as an adjunct to therapy and rest.  · Surgery to remove the inflamed tendon lining or degenerated tendon tissue is rarely necessary and has shown less than predictable results.  MEDICATION   · Nonsteroidal anti-inflammatory medications, such as aspirin and ibuprofen, may be used for pain and inflammation relief. Do not take within 7 days before surgery. Take these as directed by your caregiver. Contact your caregiver immediately if any bleeding, stomach upset, or signs of allergic reaction occur. Other minor pain relievers, such as acetaminophen, may also be used.  · Pain relievers may be prescribed as necessary by your caregiver. Do not take prescription pain medication for longer than 4 to 7 days. Use only as directed and only as much as you need.  · Cortisone injections are rarely indicated. Cortisone injections may weaken tendons and predispose to rupture. It is better   to give the condition more time to heal than to use them.  HEAT AND COLD  · Cold is used to relieve pain and reduce inflammation for acute and chronic Achilles tendinitis. Cold should be applied for 10 to 15 minutes every 2 to 3 hours for inflammation and pain and immediately after any activity that aggravates your symptoms. Use ice packs or an ice  massage.  · Heat may be used before performing stretching and strengthening activities prescribed by your caregiver. Use a heat pack or a warm soak.  SEEK MEDICAL CARE IF:  · Symptoms get worse or do not improve in 2 weeks despite treatment.  · New, unexplained symptoms develop. Drugs used in treatment may produce side effects.  EXERCISES  RANGE OF MOTION (ROM) AND STRETCHING EXERCISES - Achilles Tendinitis   These exercises may help you when beginning to rehabilitate your injury. Your symptoms may resolve with or without further involvement from your physician, physical therapist or athletic trainer. While completing these exercises, remember:   · Restoring tissue flexibility helps normal motion to return to the joints. This allows healthier, less painful movement and activity.  · An effective stretch should be held for at least 30 seconds.  · A stretch should never be painful. You should only feel a gentle lengthening or release in the stretched tissue.  STRETCH - Gastroc, Standing   · Place hands on wall.  · Extend right / left leg, keeping the front knee somewhat bent.  · Slightly point your toes inward on your back foot.  · Keeping your right / left heel on the floor and your knee straight, shift your weight toward the wall, not allowing your back to arch.  · You should feel a gentle stretch in the right / left calf. Hold this position for __________ seconds.  Repeat __________ times. Complete this stretch __________ times per day.  STRETCH - Soleus, Standing   · Place hands on wall.  · Extend right / left leg, keeping the other knee somewhat bent.  · Slightly point your toes inward on your back foot.  · Keep your right / left heel on the floor, bend your back knee, and slightly shift your weight over the back leg so that you feel a gentle stretch deep in your back calf.  · Hold this position for __________ seconds.  Repeat __________ times. Complete this stretch __________ times per day.  STRETCH -  Gastrocsoleus, Standing   Note: This exercise can place a lot of stress on your foot and ankle. Please complete this exercise only if specifically instructed by your caregiver.   · Place the ball of your right / left foot on a step, keeping your other foot firmly on the same step.  · Hold on to the wall or a rail for balance.  · Slowly lift your other foot, allowing your body weight to press your heel down over the edge of the step.  · You should feel a stretch in your right / left calf.  · Hold this position for __________ seconds.  · Repeat this exercise with a slight bend in your knee.  Repeat __________ times. Complete this stretch __________ times per day.   STRENGTHENING EXERCISES - Achilles Tendinitis  These exercises may help you when beginning to rehabilitate your injury. They may resolve your symptoms with or without further involvement from your physician, physical therapist or athletic trainer. While completing these exercises, remember:   · Muscles can gain both the endurance   and the strength needed for everyday activities through controlled exercises.  · Complete these exercises as instructed by your physician, physical therapist or athletic trainer. Progress the resistance and repetitions only as guided.  · You may experience muscle soreness or fatigue, but the pain or discomfort you are trying to eliminate should never worsen during these exercises. If this pain does worsen, stop and make certain you are following the directions exactly. If the pain is still present after adjustments, discontinue the exercise until you can discuss the trouble with your clinician.  STRENGTH - Plantar-flexors   · Sit with your right / left leg extended. Holding onto both ends of a rubber exercise band/tubing, loop it around the ball of your foot. Keep a slight tension in the band.  · Slowly push your toes away from you, pointing them downward.  · Hold this position for __________ seconds. Return slowly, controlling the  tension in the band/tubing.  Repeat __________ times. Complete this exercise __________ times per day.   STRENGTH - Plantar-flexors   · Stand with your feet shoulder width apart. Steady yourself with a wall or table using as little support as needed.  · Keeping your weight evenly spread over the width of your feet, rise up on your toes.*  · Hold this position for __________ seconds.  Repeat __________ times. Complete this exercise __________ times per day.   *If this is too easy, shift your weight toward your right / left leg until you feel challenged. Ultimately, you may be asked to do this exercise with your right / left foot only.  STRENGTH - Plantar-flexors, Eccentric   Note: This exercise can place a lot of stress on your foot and ankle. Please complete this exercise only if specifically instructed by your caregiver.   · Place the balls of your feet on a step. With your hands, use only enough support from a wall or rail to keep your balance.  · Keep your knees straight and rise up on your toes.  · Slowly shift your weight entirely to your right / left toes and pick up your opposite foot. Gently and with controlled movement, lower your weight through your right / left foot so that your heel drops below the level of the step. You will feel a slight stretch in the back of your calf at the end position.  · Use the healthy leg to help rise up onto the balls of both feet, then lower weight only on the right / left leg again. Build up to 15 repetitions. Then progress to 3 consecutive sets of 15 repetitions.*  · After completing the above exercise, complete the same exercise with a slight knee bend (about 30 degrees). Again, build up to 15 repetitions. Then progress to 3 consecutive sets of 15 repetitions.*  Perform this exercise __________ times per day.   *When you easily complete 3 sets of 15, your physician, physical therapist or athletic trainer may advise you to add resistance by wearing a backpack filled with  additional weight.  STRENGTH - Plantar Flexors, Seated   · Sit on a chair that allows your feet to rest flat on the ground. If necessary, sit at the edge of the chair.  · Keeping your toes firmly on the ground, lift your right / left heel as far as you can without increasing any discomfort in your ankle.  Repeat __________ times. Complete this exercise __________ times a day.  *If instructed by your physician, physical therapist or athletic   trainer, you may add ____________________ of resistance by placing a weighted object on your right / left knee.  Document Released: 11/18/2004 Document Revised: 07/12/2011 Document Reviewed: 08/01/2008  Exit[employer]® Patient Information ©2015 [employer]. This information is not intended to replace advice given to you by your health care provider. Make sure you discuss any questions you have with your health care provider.

## 2015-01-03 ENCOUNTER — Encounter: Payer: Self-pay | Admitting: Podiatry

## 2015-01-03 NOTE — Progress Notes (Signed)
Patient ID: NANA HOSELTON, male   DOB: 1955-09-05, 59 y.o.   MRN: [id number]  Subjective: 59 year old male presents the office today for follow-up with vibration left leg Achilles tendinitis. He states that he continues to have pain. He's been stretching and icing daily. Aspirin continue with the night splint. Denies any increase in swelling or redness. No recent injury or trauma. He tried using Voltaren gel which seem to make the area worse. No other complaints at this time.  Objective: AAO 3, NAD DP/PT pulses palpable, CRT less than 3 seconds Protective sensation intact with Simms once the monofilament Discontinue tenderness on the course of the Achilles tendon on the insertion in the calcaneus. There is no pain on the midsubstance the Achilles tendon. There is no defect noted. Thompson's is negative. There is no pain with lateral compression of the calcaneus. No pain on the plantar medial tubercle of the calcaneus at insertion of the plantar fascia. No other areas of tenderness to bilateral lower extremities. This chronic edema to the left lower extremity. There is no overlying increase in warmth or erythema. No open lesions or pre-ulcerative lesions.   Assessment: 59 year old male with left leg Achilles tendinitis  Plan: -Treatment options discussed including all alternatives, risks, and complications -I do believe that at this time as he continues to have pain likely benefit from physical therapy. He has tried stretching icing and general activities at home without much relief. We need to hold off on anti-inflammatories given his Coumadin. He has previously already had a steroid injection. Discussed with him that my very difficult position is forced treatment for this. I discussed possible mobilization a cam boot however I think we should try physical therapy first and that given his history of DVT immobilization puts him at increased risk.  -Follow-up after PT or sooner if any problems  arise. In the meantime, encouraged to call the office with any questions, concerns, change in symptoms.   Ovid Curd, DPM

## 2015-01-28 ENCOUNTER — Other Ambulatory Visit: Payer: Self-pay | Admitting: Vascular Surgery

## 2015-01-29 ENCOUNTER — Ambulatory Visit
Admission: RE | Admit: 2015-01-29 | Discharge: 2015-01-29 | Disposition: A | Discharge status: Home / Self Care | Payer: BLUE CROSS/BLUE SHIELD | Source: Ambulatory Visit | Attending: Vascular Surgery | Admitting: Vascular Surgery

## 2015-01-29 ENCOUNTER — Encounter: Payer: Self-pay | Admitting: *Deleted

## 2015-01-29 ENCOUNTER — Encounter
Admission: RE | Disposition: A | Discharge status: Home / Self Care | Payer: Self-pay | Source: Ambulatory Visit | Attending: Vascular Surgery

## 2015-01-29 DIAGNOSIS — I82409 Acute embolism and thrombosis of unspecified deep veins of unspecified lower extremity: Secondary | ICD-10-CM

## 2015-01-29 DIAGNOSIS — I10 Essential (primary) hypertension: Secondary | ICD-10-CM | POA: Diagnosis not present

## 2015-01-29 DIAGNOSIS — I82402 Acute embolism and thrombosis of unspecified deep veins of left lower extremity: Secondary | ICD-10-CM | POA: Insufficient documentation

## 2015-01-29 DIAGNOSIS — E669 Obesity, unspecified: Secondary | ICD-10-CM | POA: Insufficient documentation

## 2015-01-29 DIAGNOSIS — Z6839 Body mass index (BMI) 39.0-39.9, adult: Secondary | ICD-10-CM | POA: Diagnosis not present

## 2015-01-29 DIAGNOSIS — I82401 Acute embolism and thrombosis of unspecified deep veins of right lower extremity: Secondary | ICD-10-CM | POA: Insufficient documentation

## 2015-01-29 DIAGNOSIS — I8310 Varicose veins of unspecified lower extremity with inflammation: Secondary | ICD-10-CM | POA: Diagnosis not present

## 2015-01-29 HISTORY — PX: PERIPHERAL VASCULAR CATHETERIZATION: SHX172C

## 2015-01-29 HISTORY — DX: Restless legs syndrome: G25.81

## 2015-01-29 LAB — PROTIME-INR
INR: 1.48
Prothrombin Time: 18.1 seconds — ABNORMAL HIGH (ref 11.4–15.0)

## 2015-01-29 SURGERY — IVC FILTER INSERTION
Anesthesia: Moderate Sedation

## 2015-01-29 MED ORDER — CHLORHEXIDINE GLUCONATE CLOTH 2 % EX PADS: 6.0000 | MEDICATED_PAD | Freq: Once | CUTANEOUS | Status: DC

## 2015-01-29 MED ORDER — HEPARIN (PORCINE) IN NACL 2-0.9 UNIT/ML-% IJ SOLN
INTRAMUSCULAR | Status: AC
  Filled 2015-01-29: qty 500

## 2015-01-29 MED ORDER — DEXTROSE 5 % IV SOLN
1.5000 g | INTRAVENOUS | Status: AC
  Administered 2015-01-29: 1.5 g via INTRAVENOUS

## 2015-01-29 MED ORDER — DEXTROSE 5 % IV SOLN
INTRAVENOUS | Status: AC
  Filled 2015-01-29: qty 1.5

## 2015-01-29 MED ORDER — MIDAZOLAM HCL 5 MG/5ML IJ SOLN
INTRAMUSCULAR | Status: AC
  Filled 2015-01-29: qty 5

## 2015-01-29 MED ORDER — LIDOCAINE-EPINEPHRINE (PF) 1 %-1:200000 IJ SOLN
INTRAMUSCULAR | Status: AC
  Filled 2015-01-29: qty 30

## 2015-01-29 MED ORDER — MIDAZOLAM HCL 2 MG/2ML IJ SOLN
INTRAMUSCULAR | Status: DC | PRN
  Administered 2015-01-29: 2 mg via INTRAVENOUS

## 2015-01-29 MED ORDER — SODIUM CHLORIDE 0.9 % IV SOLN
INTRAVENOUS | Status: DC
  Administered 2015-01-29: 10:00:00 via INTRAVENOUS

## 2015-01-29 MED ORDER — FENTANYL CITRATE (PF) 100 MCG/2ML IJ SOLN
INTRAMUSCULAR | Status: DC | PRN
  Administered 2015-01-29: 50 ug via INTRAVENOUS

## 2015-01-29 MED ORDER — FENTANYL CITRATE (PF) 100 MCG/2ML IJ SOLN
INTRAMUSCULAR | Status: AC
  Filled 2015-01-29: qty 2

## 2015-01-29 MED ORDER — IOHEXOL 300 MG/ML  SOLN
INTRAMUSCULAR | Status: DC | PRN
  Administered 2015-01-29: 15 mL via INTRAVENOUS

## 2015-01-29 SURGICAL SUPPLY — 3 items
FILTER VC CELECT-FEMORAL (Filter) ×3 IMPLANT
PACK ANGIOGRAPHY (CUSTOM PROCEDURE TRAY) ×3 IMPLANT
WIRE J 3MM .035X145CM (WIRE) ×3 IMPLANT

## 2015-01-29 NOTE — H&P (Signed)
  [facility] History & Physical Update  The patient was interviewed and re-examined.  The patient's previous History and Physical has been reviewed and is unchanged.  There is no change in the plan of care. We plan to proceed with the scheduled procedure.  DEW,JASON, MD  01/29/2015, 10:53 AM

## 2015-01-29 NOTE — Discharge Instructions (Signed)

## 2015-01-29 NOTE — Op Note (Signed)
[facility]   OPERATIVE NOTE    PRE-OPERATIVE DIAGNOSIS: New RLE DVT while on anticoagulation for LLE DVT  POST-OPERATIVE DIAGNOSIS: same  PROCEDURE: 1.   Ultrasound guidance for vascular access to the right femoral vein 2.   Catheter placement into the inferior vena cava 3.   Inferior venacavogram 4.   Placement of a Cook Celect IVC filter  SURGEON: Festus Barren, MD  ASSISTANT(S): None  ANESTHESIA: local/sedation  ESTIMATED BLOOD LOSS: minimal  FINDING(S): 1.  Patent IVC  SPECIMEN(S):  none  INDICATIONS:   NOLE ROBEY is a 59 y.o. male who presents with a new right leg DVT while on anticoagulation for a LLE DVT.  Inferior vena cava filter is indicated for this reason.  Risks and benefits including filter thrombosis, migration, fracture, bleeding, and infection were all discussed.  We discussed that all IVC filters that we place can be removed if desired from the patient once the need for the filter has passed.    DESCRIPTION: After obtaining full informed written consent, the patient was brought back to the vascular suite. The skin was sterilely prepped and draped in a sterile surgical field was created. The right femoral vein was accessed under direct ultrasound guidance without difficulty with a Seldinger needle and a J-wire was then placed. After skin nick and dilatation, the delivery sheath was placed into the inferior vena cava and an inferior venacavogram was performed. This demonstrated a patent IVC with the level of the renal veins at the top of L1.  The filter was then deployed into the inferior vena cava at the level of the L1-L2 interspace just below the renal veins. The delivery sheath was then removed. Pressure was held. Sterile dressings were placed. The patient tolerated the procedure well and was taken to the recovery room in stable condition.  COMPLICATIONS: None  CONDITION: Stable  DEW,JASON  01/29/2015, 11:39 AM

## 2015-01-30 ENCOUNTER — Encounter: Payer: Self-pay | Admitting: Vascular Surgery

## 2015-02-03 ENCOUNTER — Telehealth: Payer: Self-pay | Admitting: *Deleted

## 2015-02-03 ENCOUNTER — Encounter: Payer: Self-pay | Admitting: *Deleted

## 2015-02-03 ENCOUNTER — Inpatient Hospital Stay: Payer: BLUE CROSS/BLUE SHIELD | Attending: Internal Medicine | Admitting: Internal Medicine

## 2015-02-03 ENCOUNTER — Inpatient Hospital Stay: Payer: BLUE CROSS/BLUE SHIELD

## 2015-02-03 VITALS — BP 139/96 | HR 76 | Temp 97.8°F | Resp 18 | Ht 64.0 in | Wt 230.0 lb

## 2015-02-03 DIAGNOSIS — Z7901 Long term (current) use of anticoagulants: Secondary | ICD-10-CM | POA: Diagnosis not present

## 2015-02-03 DIAGNOSIS — Z86718 Personal history of other venous thrombosis and embolism: Secondary | ICD-10-CM | POA: Insufficient documentation

## 2015-02-03 DIAGNOSIS — G2581 Restless legs syndrome: Secondary | ICD-10-CM | POA: Insufficient documentation

## 2015-02-03 DIAGNOSIS — I82491 Acute embolism and thrombosis of other specified deep vein of right lower extremity: Secondary | ICD-10-CM

## 2015-02-03 DIAGNOSIS — M7989 Other specified soft tissue disorders: Secondary | ICD-10-CM | POA: Diagnosis not present

## 2015-02-03 DIAGNOSIS — R0602 Shortness of breath: Secondary | ICD-10-CM | POA: Insufficient documentation

## 2015-02-03 DIAGNOSIS — Z87891 Personal history of nicotine dependence: Secondary | ICD-10-CM | POA: Insufficient documentation

## 2015-02-03 DIAGNOSIS — I87002 Postthrombotic syndrome without complications of left lower extremity: Secondary | ICD-10-CM | POA: Insufficient documentation

## 2015-02-03 DIAGNOSIS — Z79899 Other long term (current) drug therapy: Secondary | ICD-10-CM | POA: Insufficient documentation

## 2015-02-03 DIAGNOSIS — G4730 Sleep apnea, unspecified: Secondary | ICD-10-CM | POA: Insufficient documentation

## 2015-02-03 DIAGNOSIS — D6859 Other primary thrombophilia: Secondary | ICD-10-CM

## 2015-02-03 DIAGNOSIS — N400 Enlarged prostate without lower urinary tract symptoms: Secondary | ICD-10-CM | POA: Diagnosis not present

## 2015-02-03 DIAGNOSIS — I10 Essential (primary) hypertension: Secondary | ICD-10-CM | POA: Diagnosis not present

## 2015-02-03 LAB — CBC WITH DIFFERENTIAL/PLATELET
BASOS ABS: 0 10*3/uL (ref 0–0.1)
BASOS PCT: 1 %
EOS ABS: 0.1 10*3/uL (ref 0–0.7)
Eosinophils Relative: 2 %
HEMATOCRIT: 46.5 % (ref 40.0–52.0)
HEMOGLOBIN: 15.5 g/dL (ref 13.0–18.0)
Lymphocytes Relative: 21 %
Lymphs Abs: 1.1 10*3/uL (ref 1.0–3.6)
MCH: 29.5 pg (ref 26.0–34.0)
MCHC: 33.3 g/dL (ref 32.0–36.0)
MCV: 88.7 fL (ref 80.0–100.0)
Monocytes Absolute: 0.6 10*3/uL (ref 0.2–1.0)
Monocytes Relative: 12 %
NEUTROS ABS: 3.4 10*3/uL (ref 1.4–6.5)
NEUTROS PCT: 64 %
Platelets: 194 10*3/uL (ref 150–440)
RBC: 5.24 MIL/uL (ref 4.40–5.90)
RDW: 13.7 % (ref 11.5–14.5)
WBC: 5.3 10*3/uL (ref 3.8–10.6)

## 2015-02-03 LAB — COMPREHENSIVE METABOLIC PANEL
ALBUMIN: 4 g/dL (ref 3.5–5.0)
ALK PHOS: 60 U/L (ref 38–126)
ALT: 22 U/L (ref 17–63)
ANION GAP: 6 (ref 5–15)
AST: 19 U/L (ref 15–41)
BUN: 19 mg/dL (ref 6–20)
CALCIUM: 8.6 mg/dL — AB (ref 8.9–10.3)
CO2: 27 mmol/L (ref 22–32)
Chloride: 101 mmol/L (ref 101–111)
Creatinine, Ser: 0.62 mg/dL (ref 0.61–1.24)
GFR calc Af Amer: >60 mL/min (ref >60)
GFR calc non Af Amer: >60 mL/min (ref >60)
GLUCOSE: 96 mg/dL (ref 65–99)
Potassium: 4 mmol/L (ref 3.5–5.1)
SODIUM: 134 mmol/L — AB (ref 135–145)
Total Bilirubin: 0.7 mg/dL (ref 0.3–1.2)
Total Protein: 7.1 g/dL (ref 6.5–8.1)

## 2015-02-03 LAB — PROTIME-INR
INR: 1.15
Prothrombin Time: 14.9 seconds (ref 11.4–15.0)

## 2015-02-03 MED ORDER — RIVAROXABAN (XARELTO) VTE STARTER PACK (15 & 20 MG): ORAL_TABLET | ORAL | Status: DC

## 2015-02-03 NOTE — Progress Notes (Signed)
[facility] CONSULT NOTE  Patient Care Team: Sula Rumple, MD as PCP - General (Family Medicine)  CHIEF COMPLAINTS/PURPOSE OF CONSULTATION: DVT  HEMATOLOGY HISTORY: # June 2106- LEFT LE [ above Knee] on Coumadin; SEP 2016- R LE DVT  S/p IVC Filter [Dr.Dew]  HISTORY OF PRESENTING ILLNESS:  EVELIO RUEDA 59 y.o. male had arthroscopic knee surgery in May 2016 on the right knee; followed by a DVT of the left lower extremity in June 2016. Patient was appropriately started on Coumadin. However more recently in September 2016 patient started to have pain in his right lower extremity/calf- when he had an ultrasound of the leg done that showed resolving/chronic DVT of the left side; and a new acute DVT on the right lower extremity. Patient's INR approximately 3 weeks prior was 3.4.  Patient subsequently underwent IVC filter placement; he has been referred to [place] for further evaluation.  Patient complains of shortness of breath especially exertion. Denies any cough or hemoptysis. Denies any weight loss. Denies any constipation or diarrhea. Colonoscopy 4-5 years ago; next recommended in 10 years as per patient; recent PSA in June 2016 normal. Patient does not smoke currently. Denies any family history of blood clots. Denies any long flights or immobilization recently.   ROS: A complete 10 point review of system is done which is negative except mentioned above in history of present illness  MEDICAL HISTORY:  Past Medical History  Diagnosis Date  . Sleep apnea   . Hypertension   . Arthritis   . Restless leg   . Right leg DVT (HCC) 01/2015    while on anticoagulation  . Varicose veins   . Left leg DVT (HCC) 10/2014    after right knee surgery had DVT in left leg  . Cataract   . BPH (benign prostatic hyperplasia)     SURGICAL HISTORY: Past Surgical History  Procedure Laterality Date  . Knee arthroplasty Left 2005, 1983  . Elbow arthroscopy Bilateral   . Knee arthroscopy  Right 09/16/2014    Procedure: ARTHROSCOPY KNEE-partial menisectomy;  Surgeon: Donato Heinz, MD;  Location: [facility] ORS;  Service: Orthopedics;  Laterality: Right;  . Neck surgery      cervical fusion  . Peripheral vascular catheterization [name] 01/29/2015    Procedure: IVC Filter Insertion;  Surgeon: Annice Needy, MD;  Location: [facility] INVASIVE CV LAB;  Service: Cardiovascular;  Laterality: [name];    SOCIAL HISTORY: Social History   Social History  . Marital Status: Married    Spouse Name: [name]  . Number of Children: [name]  . Years of Education: [name]   Occupational History  . Not on file.   Social History Main Topics  . Smoking status: Former Smoker -- 10 years    Types: Cigarettes  . Smokeless tobacco: Former Neurosurgeon  . Alcohol Use: 0.0 oz/week    0 Standard drinks or equivalent per week     Comment: occ  . Drug Use: No  . Sexual Activity: Not on file   Other Topics Concern  . Not on file   Social History Narrative    FAMILY HISTORY: Family History  Problem Relation Age of Onset  . Lung cancer Mother 39    with brain mets  . Liver cancer Father 82    hepatocellular cancer  . Other Father 20    carcinogeneticsarcoma of the mouth  . Myelodysplastic syndrome Paternal Uncle   . Deep vein thrombosis Paternal Uncle     dvt after surgery  .  Heart disease    . Hypertension    . Liver disease      ALLERGIES:  is allergic to aleve; diclofenac; diclofenac-misoprostol; ibuprofen; and lodine.  MEDICATIONS:  Current Outpatient Prescriptions  Medication Sig Dispense Refill  . HYDROcodone-acetaminophen (NORCO/VICODIN) 5-325 MG per tablet Take 1 tablet by mouth every 6 (six) hours as needed for moderate pain.    [name] lisinopril-hydrochlorothiazide (PRINZIDE,ZESTORETIC) 10-12.5 MG per tablet Take 1 tablet by mouth daily.    [name] omeprazole (PRILOSEC) 20 MG capsule Take 20 mg by mouth daily.    . pregabalin (LYRICA) 75 MG capsule Take 150 mg by mouth 2 (two) times daily.     . Saw Palmetto 1000  MG CAPS Take 1 capsule by mouth daily.    . Tamsulosin HCl (FLOMAX PO) Take 4 mg by mouth daily.     [name] warfarin (COUMADIN) 5 MG tablet Take 5 mg by mouth one time only at 6 PM.   0  . zolpidem (AMBIEN) 10 MG tablet Take 10 mg by mouth at bedtime as needed for sleep.    . baclofen (LIORESAL) 10 MG tablet Take 10 mg by mouth 2 (two) times daily at 10 AM and 5 PM.     . celecoxib (CELEBREX) 200 MG capsule Take 200 mg by mouth 2 (two) times daily.    . Melatonin 3 MG CAPS Take by mouth.    . Rivaroxaban (XARELTO STARTER PACK) 15 & 20 MG TBPK Take as directed on package: Start with one  tablet by mouth twice a day with food. On Day 22, switch to one  tablet once a day with food. 51 each 0   No current facility-administered medications for this visit.      [name]  PHYSICAL EXAMINATION: ECOG PERFORMANCE STATUS: 1 - Symptomatic but completely ambulatory  Filed Vitals:   02/03/15 1118  BP: 139/96  Pulse: 76  Temp: 97.8 F (36.6 C)  Resp: 18   Filed Weights   02/03/15 1118  Weight: 230 lb (104.327 kg)    GENERAL: Well-nourished well-developed; Alert, no distress and comfortable.   He is alone. EYES: no pallor or icterus OROPHARYNX: no thrush or ulceration; good dentition  NECK: supple, no masses felt LYMPH:  no palpable lymphadenopathy in the cervical, axillary or inguinal regions LUNGS: clear to auscultation and  No wheeze or crackles HEART/CVS: regular rate & rhythm and no murmurs; LEFT LE- slight swollen compared to R LE ABDOMEN: abdomen soft, non-tender and normal bowel sounds Musculoskeletal:no cyanosis of digits and no clubbing  PSYCH: alert & oriented x 3 with fluent speech NEURO: no focal motor/sensory deficits SKIN:  no rashes or significant lesions  LABORATORY DATA:  I have reviewed the data as listed Lab Results  Component Value Date   WBC 6.7 02/02/2012   HGB 15.0 02/02/2012   HCT 44.7 02/02/2012   MCV 92 02/02/2012   PLT 225 02/02/2012    Recent Labs   09/09/14 1012  K 4.0    RADIOGRAPHIC STUDIES: I have personally reviewed the radiological images as listed and agreed with the findings in the report. No results found.  ASSESSMENT & PLAN:  Recurrent DVT- unprovoked left lower extremity [June 2016] and unprovoked right lower extremity [September 2016] while on Coumadin [2 weeks prior INR was 3.4]. I would recommend further workup for hypercoagulable state- prothrombin gene mutation; factor V Leiden. Also recommend CT chest [PE protocol given the shortness of breath] abdomen and pelvis for further evaluation/malignancy.  # Regards to anticoagulation-  I recommend switching to one of the novel anticoagulants- xarelto. Discussed the potential benefits especially steady levels/lack of food restrictions/no frequent INR checks; however also discussed the serious concern of absence of any antidote.  # Left lower extremity- mild postphlebitic syndrome- recommend stockings/leg elevation.   # follow up in 2 weeks.   Thank you Dr. Wyn Quaker for allowing me to participate in the care of your pleasant patient. Please do not hesitate to contact me with questions or concerns in the interim.  All questions were answered. The patient knows to call the clinic with any problems, questions or concerns.  I spent 30 minutes counseling the patient face to face. The total time spent in the appointment was 60 minutes and more than 50% was on counseling.     Earna Coder, MD 02/03/2015 12:15 PM

## 2015-02-03 NOTE — Telephone Encounter (Signed)
Patient needs to start on xarelto today. INR is not therapeutic.   Xarelto 15 mg Take as directed on package: Start with one  tablet by mouth twice a day with food. On Day 22, switch to one Xarelto  tablet once a day with food.  I was not successful in reaching the patient today. I left msg on patient's voice mail to pick up RX today and that he needed to start this new med asap as previously directed.

## 2015-02-03 NOTE — Patient Instructions (Signed)
A prescription for Xarelto has been called into your pharmacy. DO NOT START THIS PRESCRIPTION UNTIL YOU HEAR ABOUT YOUR LAB RESULTS FROM OUR OFFICE.  Rivaroxaban oral tablets What is this medicine? RIVAROXABAN (ri va ROX a ban) is an anticoagulant (blood thinner). It is used to treat blood clots in the lungs or in the veins. It is also used after knee or hip surgeries to prevent blood clots. It is also used to lower the chance of stroke in people with a medical condition called atrial fibrillation. This medicine may be used for other purposes; ask your health care provider or pharmacist if you have questions. COMMON BRAND NAME(S): Xarelto, Xarelto Starter Pack What should I tell my health care provider before I take this medicine? They need to know if you have any of these conditions: -bleeding disorders -bleeding in the brain -blood in your stools (black or tarry stools) or if you have blood in your vomit -history of stomach bleeding -kidney disease -liver disease -low blood counts, like low white cell, platelet, or red cell counts -recent or planned spinal or epidural procedure -take medicines that treat or prevent blood clots -an unusual or allergic reaction to rivaroxaban, other medicines, foods, dyes, or preservatives -pregnant or trying to get pregnant -breast-feeding How should I use this medicine? Take this medicine by mouth with a glass of water. Follow the directions on the prescription label. Take your medicine at regular intervals. Do not take it more often than directed. Do not stop taking except on your doctor's advice. Stopping this medicine may increase your risk of a blot clot. Be sure to refill your prescription before you run out of medicine. If you are taking this medicine after hip or knee replacement surgery, take it with or without food. If you are taking this medicine for atrial fibrillation, take it with your evening meal. If you are taking this medicine to treat  blood clots, take it with food at the same time each day. If you are unable to swallow your tablet, you may crush the tablet and mix it in applesauce. Then, immediately eat the applesauce. You should eat more food right after you eat the applesauce containing the crushed tablet. Talk to your pediatrician regarding the use of this medicine in children. Special care may be needed. Overdosage: If you think you have taken too much of this medicine contact a poison control center or emergency room at once. NOTE: This medicine is only for you. Do not share this medicine with others. What if I miss a dose? If you take your medicine once a day and miss a dose, take the missed dose as soon as you remember. If you take your medicine twice a day and miss a dose, take the missed dose immediately. In this instance, 2 tablets may be taken at the same time. The next day you should take 1 tablet twice a day as directed. What may interact with this medicine? -aspirin and aspirin-like medicines -certain antibiotics like erythromycin, azithromycin, and clarithromycin -certain medicines for fungal infections like ketoconazole and itraconazole -certain medicines for irregular heart beat like amiodarone, quinidine, dronedarone -certain medicines for seizures like carbamazepine, phenytoin -certain medicines that treat or prevent blood clots like warfarin, enoxaparin, and dalteparin -conivaptan -diltiazem -felodipine -indinavir -lopinavir; ritonavir -NSAIDS, medicines for pain and inflammation, like ibuprofen or naproxen -ranolazine -rifampin -ritonavir -St. John's wort -verapamil This list may not describe all possible interactions. Give your health care provider a list of all the medicines, herbs, non-prescription  drugs, or dietary supplements you use. Also tell them if you smoke, drink alcohol, or use illegal drugs. Some items may interact with your medicine. What should I watch for while using this  medicine? Visit your doctor or health care professional for regular checks on your progress. Your condition will be monitored carefully while you are receiving this medicine. Notify your doctor or health care professional and seek emergency treatment if you develop breathing problems; changes in vision; chest pain; severe, sudden headache; pain, swelling, warmth in the leg; trouble speaking; sudden numbness or weakness of the face, arm, or leg. These can be signs that your condition has gotten worse. If you are going to have surgery, tell your doctor or health care professional that you are taking this medicine. Tell your health care professional that you use this medicine before you have a spinal or epidural procedure. Sometimes people who take this medicine have bleeding problems around the spine when they have a spinal or epidural procedure. This bleeding is very rare. If you have a spinal or epidural procedure while on this medicine, call your health care professional immediately if you have back pain, numbness or tingling (especially in your legs and feet), muscle weakness, paralysis, or loss of bladder or bowel control. Avoid sports and activities that might cause injury while you are using this medicine. Severe falls or injuries can cause unseen bleeding. Be careful when using sharp tools or knives. Consider using an Neurosurgeon. Take special care brushing or flossing your teeth. Report any injuries, bruising, or red spots on the skin to your doctor or health care professional. What side effects may I notice from receiving this medicine? Side effects that you should report to your doctor or health care professional as soon as possible: -allergic reactions like skin rash, itching or hives, swelling of the face, lips, or tongue -back pain -redness, blistering, peeling or loosening of the skin, including inside the mouth -signs and symptoms of bleeding such as bloody or black, tarry stools; red or  dark-brown urine; spitting up blood or brown material that looks like coffee grounds; red spots on the skin; unusual bruising or bleeding from the eye, gums, or nose Side effects that usually do not require medical attention (Report these to your doctor or health care professional if they continue or are bothersome.): -dizziness -muscle pain This list may not describe all possible side effects. Call your doctor for medical advice about side effects. You may report side effects to FDA at 1-800-FDA-1088. Where should I keep my medicine? Keep out of the reach of children. Store at room temperature between 15 and 30 degrees C (59 and 86 degrees F). Throw away any unused medicine after the expiration date. NOTE: This sheet is a summary. It may not cover all possible information. If you have questions about this medicine, talk to your doctor, pharmacist, or health care provider.  2015, [employer]vier/Gold Standard. (2013-08-09 18:47:48)

## 2015-02-04 ENCOUNTER — Other Ambulatory Visit: Payer: Self-pay | Admitting: *Deleted

## 2015-02-04 ENCOUNTER — Telehealth: Payer: Self-pay | Admitting: *Deleted

## 2015-02-04 DIAGNOSIS — D6859 Other primary thrombophilia: Secondary | ICD-10-CM

## 2015-02-04 MED ORDER — RIVAROXABAN (XARELTO) VTE STARTER PACK (15 & 20 MG): ORAL_TABLET | ORAL | Status: DC

## 2015-02-04 NOTE — Telephone Encounter (Signed)
Patient called spoke to [facility] in cancer center triage. xarelto has not called into [facility].  Spoke with pharmacist. Ruben [name] did not get e-scribed.   Xarelto RX called into pharamcy  Xarelto 15 and 20 mg Starter Pack Sig: Take as directed on package: Start with one  tablet by mouth twice a day with food. On Day 22, switch to one  tablet once a day with food.

## 2015-02-04 NOTE — Telephone Encounter (Signed)
Spoke with patient this morning. Explained that INR was not in the therapeutic range. 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 Patient will go to pharmacy this morning and pick up RX. Patient understands that if he is unable to get RX today for any reason due to personal or financial hardships, he will start back on the coumadin ASAP. He will call our office as well if he is not able to start the Xarelto. He understand that he is not to take both coumadin and xarelto at the same time. He will discontinue the coumadin. Directions for xarelto administration were reviewed with patient. Patient is to take Xarelto 15 mg twice daily with meals x 21 days.  On day 22, he will start xarelto 20 mg once daily with meals. Teach back process performed with patient. Questions were answered to his satisfaction.

## 2015-02-05 ENCOUNTER — Telehealth: Payer: Self-pay | Admitting: *Deleted

## 2015-02-05 NOTE — Progress Notes (Signed)
Quick Note:  labs abnormal/INR subtherapeutic. Please make sure patient has started Xarelto. ______

## 2015-02-05 NOTE — Telephone Encounter (Signed)
Left voice mail message for patient. Inquiring if patient was able to pick up his xarelto.

## 2015-02-05 NOTE — Telephone Encounter (Signed)
Patient called back. He did start on the xarelto. md informed.

## 2015-02-06 ENCOUNTER — Ambulatory Visit: Admission: RE | Admit: 2015-02-06 | Payer: BLUE CROSS/BLUE SHIELD | Source: Ambulatory Visit

## 2015-02-06 ENCOUNTER — Ambulatory Visit
Admission: RE | Admit: 2015-02-06 | Discharge: 2015-02-06 | Disposition: A | Discharge status: Home / Self Care | Payer: BLUE CROSS/BLUE SHIELD | Source: Ambulatory Visit | Attending: Internal Medicine | Admitting: Internal Medicine

## 2015-02-06 DIAGNOSIS — R0602 Shortness of breath: Secondary | ICD-10-CM | POA: Insufficient documentation

## 2015-02-06 DIAGNOSIS — D6859 Other primary thrombophilia: Secondary | ICD-10-CM

## 2015-02-06 DIAGNOSIS — Z86718 Personal history of other venous thrombosis and embolism: Secondary | ICD-10-CM | POA: Insufficient documentation

## 2015-02-06 MED ORDER — IOHEXOL 350 MG/ML SOLN
150.0000 mL | Freq: Once | INTRAVENOUS | Status: AC | PRN
  Administered 2015-02-06: 125 mL via INTRAVENOUS

## 2015-02-07 LAB — PROTHROMBIN GENE MUTATION

## 2015-02-07 LAB — FACTOR 5 LEIDEN

## 2015-02-10 ENCOUNTER — Telehealth: Payer: Self-pay | Admitting: *Deleted

## 2015-02-10 NOTE — Telephone Encounter (Signed)
Results reviewed with patients via telephone. Explained to patient that the results were normal.  Educated patient to keep his appointment with Dr. Donneta Romberg next week. He states that he is tolerating the xarelto without any complaints or concerns.

## 2015-02-10 NOTE — Telephone Encounter (Signed)
Asking for CT results from Thursday

## 2015-02-19 ENCOUNTER — Encounter: Payer: Self-pay | Admitting: Internal Medicine

## 2015-02-19 ENCOUNTER — Inpatient Hospital Stay (HOSPITAL_BASED_OUTPATIENT_CLINIC_OR_DEPARTMENT_OTHER): Payer: BLUE CROSS/BLUE SHIELD | Admitting: Internal Medicine

## 2015-02-19 DIAGNOSIS — I82491 Acute embolism and thrombosis of other specified deep vein of right lower extremity: Secondary | ICD-10-CM | POA: Diagnosis not present

## 2015-02-19 DIAGNOSIS — M7989 Other specified soft tissue disorders: Secondary | ICD-10-CM | POA: Diagnosis not present

## 2015-02-19 DIAGNOSIS — I87002 Postthrombotic syndrome without complications of left lower extremity: Secondary | ICD-10-CM | POA: Diagnosis not present

## 2015-02-19 DIAGNOSIS — R0602 Shortness of breath: Secondary | ICD-10-CM | POA: Diagnosis not present

## 2015-02-19 DIAGNOSIS — Z79899 Other long term (current) drug therapy: Secondary | ICD-10-CM

## 2015-02-19 DIAGNOSIS — D6859 Other primary thrombophilia: Secondary | ICD-10-CM

## 2015-02-19 DIAGNOSIS — Z7901 Long term (current) use of anticoagulants: Secondary | ICD-10-CM

## 2015-02-19 MED ORDER — RIVAROXABAN 20 MG PO TABS: 20.0000 mg | ORAL_TABLET | Freq: Every day | ORAL | Status: DC

## 2015-02-19 NOTE — Progress Notes (Signed)
[name] CONSULT NOTE  Patient Care Team: Sula Rumpleharanjit Virk, MD as PCP - General (Family Medicine)  CHIEF COMPLAINTS/PURPOSE OF CONSULTATION: DVT  HEMATOLOGY HISTORY:  # June 2106- LEFT LE [ above Knee; arthroscopic Sx] on Coumadin; SEP 2016- R LE DVT [on coumadin sub-therapeutic]  S/p IVC Filter [Dr.Dew] SEP 2016- START XARELTO  HISTORY OF PRESENTING ILLNESS:  Ruben [name] 59 y.o. male above history of likely unprovoked DVT of the left lower extremity and then repeat DVT on the right lower extremity while on Coumadin; subtherapeutic is here for follow-up/to review the results of his labs/scan results.  Patient continues to complain of swelling in his legs mostly on the ankles especially with standing for long hours. He denies any bleeding. Denies any falls.  ROS: A complete 10 point review of system is done which is negative except mentioned above in history of present illness  MEDICAL HISTORY:  Past Medical History  Diagnosis Date  . Sleep apnea   . Hypertension   . Arthritis   . Restless leg   . Right leg DVT (HCC) 01/2015    while on anticoagulation  . Varicose veins   . Left leg DVT (HCC) 10/2014    after right knee surgery had DVT in left leg  . Cataract   . BPH (benign prostatic hyperplasia)     SURGICAL HISTORY: Past Surgical History  Procedure Laterality Date  . Knee arthroplasty Left 2005, 1983  . Elbow arthroscopy Bilateral   . Knee arthroscopy Right 09/16/2014    Procedure: ARTHROSCOPY KNEE-partial menisectomy;  Surgeon: Donato HeinzJames P Hooten, MD;  Location: [facility] ORS;  Service: Orthopedics;  Laterality: Right;  . Neck surgery      cervical fusion  . Peripheral vascular catheterization [name] 01/29/2015    Procedure: IVC Filter Insertion;  Surgeon: Annice NeedyJason S Dew, MD;  Location: [facility] INVASIVE CV LAB;  Service: Cardiovascular;  Laterality: [name];    SOCIAL HISTORY: Social History   Social History  . Marital Status: Married    Spouse Name: [name]  .  Number of Children: [name]  . Years of Education: [name]   Occupational History  . Not on file.   Social History Main Topics  . Smoking status: Former Smoker -- 1.00 packs/day for 10 years    Types: Cigarettes  . Smokeless tobacco: Former NeurosurgeonUser  . Alcohol Use: 0.0 oz/week    0 Standard drinks or equivalent per week     Comment: occ  . Drug Use: No  . Sexual Activity: Not on file   Other Topics Concern  . Not on file   Social History Narrative    FAMILY HISTORY: Family History  Problem Relation Age of Onset  . Lung cancer Mother [id number]    with brain mets  . Liver cancer Father [id number]    hepatocellular cancer  . Other Father [id number]    carcinogeneticsarcoma of the mouth  . Myelodysplastic syndrome Paternal Uncle   . Deep vein thrombosis Paternal Uncle     dvt after surgery  . Heart disease    . Hypertension    . Liver disease      ALLERGIES:  is allergic to aleve; diclofenac; diclofenac-misoprostol; ibuprofen; and lodine.  MEDICATIONS:  Current Outpatient Prescriptions  Medication Sig Dispense Refill  . baclofen (LIORESAL) 10 MG tablet Take 10 mg by mouth 2 (two) times daily at 10 AM and 5 PM.     . HYDROcodone-acetaminophen (NORCO/VICODIN) 5-325 MG per tablet Take 1 tablet by mouth every 6 (  six) hours as needed for moderate pain.    [name] lisinopril-hydrochlorothiazide (PRINZIDE,ZESTORETIC) 10-12.5 MG per tablet Take 1 tablet by mouth daily.    . Melatonin 3 MG CAPS Take by mouth.    [name] omeprazole (PRILOSEC) 20 MG capsule Take 20 mg by mouth daily.    . pregabalin (LYRICA) 75 MG capsule Take 150 mg by mouth 2 (two) times daily.     . Saw Palmetto 1000 MG CAPS Take 1 capsule by mouth daily.    . Tamsulosin HCl (FLOMAX PO) Take 4 mg by mouth daily.     [name] zolpidem (AMBIEN) 10 MG tablet Take 10 mg by mouth at bedtime as needed for sleep.    . rivaroxaban (XARELTO) 20 MG TABS tablet Take 1 tablet (20 mg total) by mouth daily with supper. 90 tablet 0   No current facility-administered  medications for this visit.      [name]  PHYSICAL EXAMINATION: ECOG PERFORMANCE STATUS: 1 - Symptomatic but completely ambulatory  There were no vitals filed for this visit. There were no vitals filed for this visit.  GENERAL: Well-nourished well-developed; Alert, no distress and comfortable.   He is alone. EYES: no pallor or icterus OROPHARYNX: no thrush or ulceration; good dentition  NECK: supple, no masses felt LYMPH:  no palpable lymphadenopathy in the cervical, axillary or inguinal regions LUNGS: clear to auscultation and  No wheeze or crackles HEART/CVS: regular rate & rhythm and no murmurs; LEFT LE- slight swollen compared to R LE ABDOMEN: abdomen soft, non-tender and normal bowel sounds Musculoskeletal:no cyanosis of digits and no clubbing  PSYCH: alert & oriented x 3 with fluent speech NEURO: no focal motor/sensory deficits SKIN:  no rashes or significant lesions  LABORATORY DATA:  I have reviewed the data as listed Lab Results  Component Value Date   WBC 5.3 02/03/2015   HGB 15.5 02/03/2015   HCT 46.5 02/03/2015   MCV 88.7 02/03/2015   PLT 194 02/03/2015    Recent Labs  09/09/14 1012 02/03/15 1229  NA  --  134*  K 4.0 4.0  CL  --  101  CO2  --  27  GLUCOSE  --  96  BUN  --  19  CREATININE  --  0.62  CALCIUM  --  8.6*  GFRNONAA  --  >60  GFRAA  --  >60  PROT  --  7.1  ALBUMIN  --  4.0  AST  --  19  ALT  --  22  ALKPHOS  --  60  BILITOT  --  0.7    RADIOGRAPHIC STUDIES: I have personally reviewed the radiological images as listed and agreed with the findings in the report. Ct Angio Chest Pe W/cm &/or Wo Cm  02/06/2015  CLINICAL DATA:  Shortness of breath.  History of DVT. EXAM: CT ANGIOGRAPHY CHEST CT ABDOMEN AND PELVIS WITH CONTRAST TECHNIQUE: Multidetector CT imaging of the chest was performed using the standard protocol during bolus administration of intravenous contrast. Multiplanar CT image reconstructions and MIPs were obtained to evaluate the  vascular anatomy. Multidetector CT imaging of the abdomen and pelvis was performed using the standard protocol during bolus administration of intravenous contrast. CONTRAST:  OMNIPAQUE IOHEXOL 350 MG/ML SOLN COMPARISON:  None. FINDINGS: CTA CHEST FINDINGS There is adequate opacification of the pulmonary arteries. There is no pulmonary embolus. The main pulmonary artery, right main pulmonary artery and left main pulmonary arteries are normal in size. The heart size is normal. There is no pericardial  effusion. There is no focal consolidation, pleural effusion or pneumothorax. There is a small band of airspace disease in the lingula likely reflecting scarring. There is no axillary, hilar, or mediastinal adenopathy. There is no lytic or blastic osseous lesion. Mild degenerative disc disease throughout the thoracic spine. CT ABDOMEN and PELVIS FINDINGS There are 2 subcentimeter hypodensities in the right hepatic lobe too small to characterize likely representing small cysts. The liver is otherwise normal. There is no intrahepatic or extrahepatic biliary ductal dilatation. The gallbladder is normal. The spleen demonstrates no focal abnormality. The pancreas is normal. The kidneys are normal. The adrenal glands are normal. The bladder is unremarkable. The stomach, duodenum, small intestine, and large intestine demonstrate no contrast extravasation or dilatation. There is no pneumoperitoneum, pneumatosis, or portal venous gas. There is no abdominal or pelvic free fluid. There is no lymphadenopathy. The abdominal aorta is normal in caliber. There is an IVC filter with the tip below the level of the renal veins. There are no lytic or sclerotic osseous lesions. Review of the MIP images confirms the above findings. IMPRESSION: 1. No evidence of pulmonary embolus. 2. No evidence of thoracic aortic dissection. 3. No acute abdominal or pelvic pathology. Electronically Signed   By: Elige Ko   On: 02/06/2015 14:11   Ct  Abdomen Pelvis W Contrast  02/06/2015  CLINICAL DATA:  Shortness of breath.  History of DVT. EXAM: CT ANGIOGRAPHY CHEST CT ABDOMEN AND PELVIS WITH CONTRAST TECHNIQUE: Multidetector CT imaging of the chest was performed using the standard protocol during bolus administration of intravenous contrast. Multiplanar CT image reconstructions and MIPs were obtained to evaluate the vascular anatomy. Multidetector CT imaging of the abdomen and pelvis was performed using the standard protocol during bolus administration of intravenous contrast. CONTRAST:  OMNIPAQUE IOHEXOL 350 MG/ML SOLN COMPARISON:  None. FINDINGS: CTA CHEST FINDINGS There is adequate opacification of the pulmonary arteries. There is no pulmonary embolus. The main pulmonary artery, right main pulmonary artery and left main pulmonary arteries are normal in size. The heart size is normal. There is no pericardial effusion. There is no focal consolidation, pleural effusion or pneumothorax. There is a small band of airspace disease in the lingula likely reflecting scarring. There is no axillary, hilar, or mediastinal adenopathy. There is no lytic or blastic osseous lesion. Mild degenerative disc disease throughout the thoracic spine. CT ABDOMEN and PELVIS FINDINGS There are 2 subcentimeter hypodensities in the right hepatic lobe too small to characterize likely representing small cysts. The liver is otherwise normal. There is no intrahepatic or extrahepatic biliary ductal dilatation. The gallbladder is normal. The spleen demonstrates no focal abnormality. The pancreas is normal. The kidneys are normal. The adrenal glands are normal. The bladder is unremarkable. The stomach, duodenum, small intestine, and large intestine demonstrate no contrast extravasation or dilatation. There is no pneumoperitoneum, pneumatosis, or portal venous gas. There is no abdominal or pelvic free fluid. There is no lymphadenopathy. The abdominal aorta is normal in caliber. There is  an IVC filter with the tip below the level of the renal veins. There are no lytic or sclerotic osseous lesions. Review of the MIP images confirms the above findings. IMPRESSION: 1. No evidence of pulmonary embolus. 2. No evidence of thoracic aortic dissection. 3. No acute abdominal or pelvic pathology. 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 Prothrombin gene mutation/factor V Leiden negative. CT chest abdomen pelvis negative for any malignancy.  # Regarding the duration of anticoagulation- given the 2 DVTs unprovoked I would recommend at least 6 months to one year of anti-coagulation.  # Left lower extremity- mild postphlebitic syndrome- recommend stockings/leg elevation.   # Patient follow-up in approximately 3 months with labs.  All questions were answered. The patient knows to call the clinic with any problems, questions or concerns.  I spent 15 minutes counseling the patient face to face. The total time spent in the appointment was 30 minutes and more than 50% was on counseling.     Earna Coder, MD 02/19/2015 11:54 AM

## 2015-02-19 NOTE — Progress Notes (Signed)
Shoulders hurt since he was taken off the celebrex to go on  . States right leg hurting more at knee area down the left side. Not sure if it is DVT or knee surgery related.

## 2015-03-31 ENCOUNTER — Other Ambulatory Visit: Payer: Self-pay | Admitting: *Deleted

## 2015-03-31 DIAGNOSIS — D6859 Other primary thrombophilia: Secondary | ICD-10-CM

## 2015-03-31 MED ORDER — RIVAROXABAN 20 MG PO TABS: 20.0000 mg | ORAL_TABLET | Freq: Every day | ORAL | Status: DC

## 2015-04-09 DIAGNOSIS — Z6841 Body Mass Index (BMI) 40.0 and over, adult: Secondary | ICD-10-CM | POA: Insufficient documentation

## 2015-04-29 ENCOUNTER — Telehealth: Payer: Self-pay | Admitting: *Deleted

## 2015-04-29 DIAGNOSIS — D6859 Other primary thrombophilia: Secondary | ICD-10-CM

## 2015-04-29 NOTE — Telephone Encounter (Signed)
Has refill at pharmacy, pt informed

## 2015-05-22 ENCOUNTER — Encounter: Payer: Self-pay | Admitting: Internal Medicine

## 2015-05-22 ENCOUNTER — Inpatient Hospital Stay: Payer: BLUE CROSS/BLUE SHIELD | Attending: Internal Medicine

## 2015-05-22 ENCOUNTER — Inpatient Hospital Stay (HOSPITAL_BASED_OUTPATIENT_CLINIC_OR_DEPARTMENT_OTHER): Payer: BLUE CROSS/BLUE SHIELD | Admitting: Internal Medicine

## 2015-05-22 VITALS — BP 136/85 | HR 85 | Temp 97.0°F | Resp 18 | Wt 238.2 lb

## 2015-05-22 DIAGNOSIS — D6859 Other primary thrombophilia: Secondary | ICD-10-CM

## 2015-05-22 DIAGNOSIS — I10 Essential (primary) hypertension: Secondary | ICD-10-CM | POA: Insufficient documentation

## 2015-05-22 DIAGNOSIS — G4730 Sleep apnea, unspecified: Secondary | ICD-10-CM | POA: Insufficient documentation

## 2015-05-22 DIAGNOSIS — Z7901 Long term (current) use of anticoagulants: Secondary | ICD-10-CM | POA: Insufficient documentation

## 2015-05-22 DIAGNOSIS — Z87891 Personal history of nicotine dependence: Secondary | ICD-10-CM | POA: Insufficient documentation

## 2015-05-22 DIAGNOSIS — G2581 Restless legs syndrome: Secondary | ICD-10-CM | POA: Diagnosis not present

## 2015-05-22 DIAGNOSIS — I87002 Postthrombotic syndrome without complications of left lower extremity: Secondary | ICD-10-CM | POA: Diagnosis not present

## 2015-05-22 DIAGNOSIS — Z79899 Other long term (current) drug therapy: Secondary | ICD-10-CM | POA: Insufficient documentation

## 2015-05-22 DIAGNOSIS — Z86718 Personal history of other venous thrombosis and embolism: Secondary | ICD-10-CM | POA: Insufficient documentation

## 2015-05-22 DIAGNOSIS — N400 Enlarged prostate without lower urinary tract symptoms: Secondary | ICD-10-CM | POA: Insufficient documentation

## 2015-05-22 DIAGNOSIS — M1990 Unspecified osteoarthritis, unspecified site: Secondary | ICD-10-CM | POA: Diagnosis not present

## 2015-05-22 LAB — HEMATOCRIT: HCT: 46.3 % (ref 40.0–52.0)

## 2015-05-22 LAB — HEPATIC FUNCTION PANEL
ALBUMIN: 3.8 g/dL (ref 3.5–5.0)
ALK PHOS: 65 U/L (ref 38–126)
ALT: 24 U/L (ref 17–63)
AST: 22 U/L (ref 15–41)
Bilirubin, Direct: 0.1 mg/dL (ref 0.1–0.5)
Indirect Bilirubin: 0.7 mg/dL (ref 0.3–0.9)
TOTAL PROTEIN: 6.7 g/dL (ref 6.5–8.1)
Total Bilirubin: 0.8 mg/dL (ref 0.3–1.2)

## 2015-05-22 LAB — HEMOGLOBIN: HEMOGLOBIN: 15.5 g/dL (ref 13.0–18.0)

## 2015-05-22 LAB — CREATININE, SERUM
CREATININE: 0.77 mg/dL (ref 0.61–1.24)
GFR calc Af Amer: >60 mL/min (ref >60)

## 2015-05-22 NOTE — Progress Notes (Signed)
[facility] CONSULT NOTE  Patient Care Team: Sula Rumple, MD as PCP - General (Family Medicine)  HEMATOLOGY HISTORY:  # June 2106- LEFT LE [ above Knee; arthroscopic Sx] on Coumadin; SEP 2016- R LE DVT [on coumadin sub-therapeutic]  S/p IVC Filter [Dr.Dew] SEP 2016- START XARELTO  HISTORY OF PRESENTING ILLNESS:  Ruben [name] 60 y.o. male above history of likely unprovoked DVT of the left lower extremity and then repeat DVT on the right lower extremity while on Coumadin; subtherapeutic currently on Xarelto since September 2016 is here for follow-up.  Patient continues to complain of swelling in his legs mostly on the ankles especially with standing for long hours. He is currently using graduated stocking-seems to be helping. He denies any bleeding. Denies any falls. Denies any new history of DVT or PEs.   ROS: A complete 10 point review of system is done which is negative except mentioned above in history of present illness  MEDICAL HISTORY:  Past Medical History  Diagnosis Date  . Sleep apnea   . Hypertension   . Arthritis   . Restless leg   . Right leg DVT (HCC) 01/2015    while on anticoagulation  . Varicose veins   . Left leg DVT (HCC) 10/2014    after right knee surgery had DVT in left leg  . Cataract   . BPH (benign prostatic hyperplasia)     SURGICAL HISTORY: Past Surgical History  Procedure Laterality Date  . Knee arthroplasty Left 2005, 1983  . Elbow arthroscopy Bilateral   . Knee arthroscopy Right 09/16/2014    Procedure: ARTHROSCOPY KNEE-partial menisectomy;  Surgeon: Donato Heinz, MD;  Location: [facility] ORS;  Service: Orthopedics;  Laterality: Right;  . Neck surgery      cervical fusion  . Peripheral vascular catheterization [name] 01/29/2015    Procedure: IVC Filter Insertion;  Surgeon: Annice Needy, MD;  Location: [facility] INVASIVE CV LAB;  Service: Cardiovascular;  Laterality: [name];    SOCIAL HISTORY: Social History   Social History  . Marital  Status: Married    Spouse Name: [name]  . Number of Children: [name]  . Years of Education: [name]   Occupational History  . Not on file.   Social History Main Topics  . Smoking status: Former Smoker -- 1.00 packs/day for 10 years    Types: Cigarettes  . Smokeless tobacco: Former Neurosurgeon  . Alcohol Use: 0.6 oz/week    0 Standard drinks or equivalent, 1 Shots of liquor per week     Comment: occ  . Drug Use: No  . Sexual Activity: Not on file   Other Topics Concern  . Not on file   Social History Narrative    FAMILY HISTORY: Family History  Problem Relation Age of Onset  . Lung cancer Mother 13    with brain mets  . Liver cancer Father 81    hepatocellular cancer  . Other Father 47    carcinogeneticsarcoma of the mouth  . Myelodysplastic syndrome Paternal Uncle   . Deep vein thrombosis Paternal Uncle     dvt after surgery  . Heart disease    . Hypertension    . Liver disease      ALLERGIES:  is allergic to aleve; diclofenac; diclofenac-misoprostol; ibuprofen; and lodine.  MEDICATIONS:  Current Outpatient Prescriptions  Medication Sig Dispense Refill  . baclofen (LIORESAL) 10 MG tablet Take 10 mg by mouth 2 (two) times daily at 10 AM and 5 PM.     .  HYDROcodone-acetaminophen (NORCO/VICODIN) 5-325 MG per tablet Take 1 tablet by mouth every 6 (six) hours as needed for moderate pain.    [name] lisinopril-hydrochlorothiazide (PRINZIDE,ZESTORETIC) 10-12.5 MG per tablet Take 1 tablet by mouth daily.    [name] omeprazole (PRILOSEC) 20 MG capsule Take 20 mg by mouth daily.    . pregabalin (LYRICA) 75 MG capsule Take 150 mg by mouth 2 (two) times daily.     . rivaroxaban (XARELTO) 20 MG TABS tablet Take 1 tablet (20 mg total) by mouth daily with supper. 90 tablet 0  . Saw Palmetto 1000 MG CAPS Take 1 capsule by mouth daily.    . Tamsulosin HCl (FLOMAX PO) Take 4 mg by mouth daily.     [name] zolpidem (AMBIEN) 10 MG tablet Take 10 mg by mouth at bedtime as needed for sleep.    . Melatonin 3 MG CAPS  Take by mouth. Reported on 05/22/2015     No current facility-administered medications for this visit.      [name]  PHYSICAL EXAMINATION: ECOG PERFORMANCE STATUS: 1 - Symptomatic but completely ambulatory  Filed Vitals:   05/22/15 1052  BP: 136/85  Pulse: 85  Temp: 97 F (36.1 C)  Resp: 18   Filed Weights   05/22/15 1052  Weight: 238 lb 3.3 oz (108.05 kg)    GENERAL: Well-nourished well-developed; Alert, no distress and comfortable.   He is alone. EYES: no pallor or icterus OROPHARYNX: no thrush or ulceration; good dentition  NECK: supple, no masses felt LYMPH:  no palpable lymphadenopathy in the cervical, axillary or inguinal regions LUNGS: clear to auscultation and  No wheeze or crackles HEART/CVS: regular rate & rhythm and no murmurs; LEFT LE- slight swollen compared to R LE ABDOMEN: abdomen soft, non-tender and normal bowel sounds Musculoskeletal:no cyanosis of digits and no clubbing  PSYCH: alert & oriented x 3 with fluent speech NEURO: no focal motor/sensory deficits SKIN:  no rashes or significant lesions  LABORATORY DATA:  I have reviewed the data as listed Lab Results  Component Value Date   WBC 5.3 02/03/2015   HGB 15.5 05/22/2015   HCT 46.3 05/22/2015   MCV 88.7 02/03/2015   PLT 194 02/03/2015    Recent Labs  09/09/14 1012 02/03/15 1229 05/22/15 1020  NA  --  134*  --   K 4.0 4.0  --   CL  --  101  --   CO2  --  27  --   GLUCOSE  --  96  --   BUN  --  19  --   CREATININE  --  0.62 0.77  CALCIUM  --  8.6*  --   GFRNONAA  --  >60 >60  GFRAA  --  >60 >60  PROT  --  7.1 6.7  ALBUMIN  --  4.0 3.8  AST  --  19 22  ALT  --  22 24  ALKPHOS  --  60 65  BILITOT  --  0.7 0.8  BILIDIR  --   --  0.1  IBILI  --   --  0.7    RADIOGRAPHIC STUDIES: I have personally reviewed the radiological images as listed and agreed with the findings in the report. No results found.  ASSESSMENT & PLAN:  Recurrent DVT- unprovoked left lower extremity [June 2016] and  unprovoked right lower extremity [September 2016] while on Coumadin [2 weeks prior INR was 3.4]. Prothrombin gene mutation/factor V Leiden negative. Recommend finishing total of 6-12 months of anticoagulation. Patient is  planning to have a gastric sleeve surgery the next few months; if this is the case he has to come off anticoagulation. He'll call and let us know. He'll call us for prescription refills.  # Left lower extremity- mild postphlebitic syndrome- recommend stockings/leg elevation.   # Follow-up in 6 months with BMP CBC.  I spent 15 minutes counseling the patient face to face. The total time spent in the appointment was 30 minutes and more than 50% was on counseling.     Ruben Coder, MD 05/22/2015 11:01 AM

## 2015-05-28 DIAGNOSIS — G4733 Obstructive sleep apnea (adult) (pediatric): Secondary | ICD-10-CM | POA: Insufficient documentation

## 2015-05-29 ENCOUNTER — Other Ambulatory Visit: Payer: Self-pay | Admitting: Specialist

## 2015-05-29 DIAGNOSIS — Z01818 Encounter for other preprocedural examination: Secondary | ICD-10-CM

## 2015-06-04 ENCOUNTER — Ambulatory Visit
Admission: RE | Admit: 2015-06-04 | Discharge: 2015-06-04 | Disposition: A | Discharge status: Home / Self Care | Payer: BLUE CROSS/BLUE SHIELD | Source: Ambulatory Visit | Attending: Specialist | Admitting: Specialist

## 2015-06-04 DIAGNOSIS — Z01818 Encounter for other preprocedural examination: Secondary | ICD-10-CM

## 2015-08-04 ENCOUNTER — Other Ambulatory Visit: Payer: Self-pay | Admitting: Internal Medicine

## 2015-08-04 ENCOUNTER — Other Ambulatory Visit: Payer: Self-pay | Admitting: *Deleted

## 2015-08-04 DIAGNOSIS — D6859 Other primary thrombophilia: Secondary | ICD-10-CM

## 2015-08-04 MED ORDER — RIVAROXABAN 20 MG PO TABS: 20.0000 mg | ORAL_TABLET | Freq: Every day | ORAL | Status: DC

## 2015-10-02 ENCOUNTER — Ambulatory Visit: Payer: Self-pay

## 2015-10-02 ENCOUNTER — Ambulatory Visit (INDEPENDENT_AMBULATORY_CARE_PROVIDER_SITE_OTHER): Payer: BLUE CROSS/BLUE SHIELD

## 2015-10-02 ENCOUNTER — Ambulatory Visit (INDEPENDENT_AMBULATORY_CARE_PROVIDER_SITE_OTHER): Payer: BLUE CROSS/BLUE SHIELD | Admitting: Podiatry

## 2015-10-02 ENCOUNTER — Encounter: Payer: Self-pay | Admitting: Podiatry

## 2015-10-02 VITALS — BP 166/91 | HR 79 | Resp 16

## 2015-10-02 DIAGNOSIS — M79671 Pain in right foot: Secondary | ICD-10-CM

## 2015-10-02 DIAGNOSIS — M7662 Achilles tendinitis, left leg: Secondary | ICD-10-CM

## 2015-10-02 DIAGNOSIS — M79672 Pain in left foot: Secondary | ICD-10-CM | POA: Diagnosis not present

## 2015-10-02 MED ORDER — TRIAMCINOLONE ACETONIDE 10 MG/ML IJ SUSP
10.0000 mg | Freq: Once | INTRAMUSCULAR | Status: AC
  Administered 2015-10-02: 10 mg

## 2015-10-02 NOTE — Progress Notes (Signed)
Subjective:     Patient ID: Ruben [name], male   DOB: [birth year], 60 y.o.   MRN: [id number]  HPI patient presents with pain in the fourth metatarsophalangeal joint left and in the left plantar heel with mild discomfort also in the Achilles tendon which bothers him at different times   Review of Systems     Objective:   Physical Exam Neurovascular status intact muscle strength adequate with inflammatory changes of the fourth MPJ left with fluid buildup and pain in the plantar heel left at the insertional point tendon calcaneus with also moderate discomfort in the Achilles tendon left    Assessment:     Inflammatory capsulitis fourth MPJ left with plantar fasciitis Achilles tendinitis left    Plan:     X-ray taken reviewed and today I injected the plantar heel left 3 mg Kenalog 5 mg Xylocaine and did a forefoot block of left foot around the fourth MPJ and then aspirated the joint getting out a small amount of clear fluid and injected with a quarter cc dexamethasone Kenalog and applied thick plantar padding bilateral. Reappoint to recheck again and also we will review orthotics and consider new orthotics at that time  Report indicates that there is spurring of the posterior plantar heel bilateral with moderate elevation of the arch height bilateral

## 2015-10-02 NOTE — Patient Instructions (Signed)

## 2015-10-29 ENCOUNTER — Encounter: Payer: Self-pay | Admitting: Podiatry

## 2015-10-29 ENCOUNTER — Ambulatory Visit (INDEPENDENT_AMBULATORY_CARE_PROVIDER_SITE_OTHER): Payer: BLUE CROSS/BLUE SHIELD | Admitting: Podiatry

## 2015-10-29 DIAGNOSIS — M79672 Pain in left foot: Secondary | ICD-10-CM | POA: Diagnosis not present

## 2015-10-29 DIAGNOSIS — M779 Enthesopathy, unspecified: Secondary | ICD-10-CM

## 2015-10-30 NOTE — Progress Notes (Signed)
Subjective:     Patient ID: Ruben [name], male   DOB: [birth year], 60 y.o.   MRN: [id number]  HPI patient presents with improvement in pain left foot but continuing with continued chronic heel pain bilateral   Review of Systems     Objective:   Physical Exam Neurovascular status found to be intact with patient continuing to have discomfort around the fourth metatarsal head left with a prominence of the metatarsal and overall just poor foot structure that has given him trouble over the years    Assessment:     Continued inflammatory changes around the fourth MPJ left which I do think that this is related to the plantar flexion of the bone creating pain with chronic pain in his feet    Plan:     H&P and reviewed condition. I've recommended a Berkley type orthotic with a deep heel seat which was done with a pad around the fourth metatarsal disperse weight. I did do for molding process on him in order to get the best fit possible

## 2015-11-10 ENCOUNTER — Telehealth: Payer: Self-pay | Admitting: *Deleted

## 2015-11-10 NOTE — Telephone Encounter (Signed)
-----   Message from Janna ArchStacey F McDonald, RN sent at 11/10/2015  9:21 AM EDT ----- Regarding: Carlena HurlXARELTO PRESCRIPTION Patient called regarding xarelto.  He has completed this current prescription as of Saturday 11/08/15.  His next appt is 7/19 with you.  Do you want to refill Xarelto until that appt, or wait for him to see you?

## 2015-11-10 NOTE — Telephone Encounter (Signed)
Spoke with Dr. Donneta RombergBrahmanday. Patient may stop his xarelto. He has completed this course of therapy.  Pt made aware.  Teach back process performed. I asked him to keep his appointment on on 11/19/15 as previously scheduled.

## 2015-11-19 ENCOUNTER — Inpatient Hospital Stay: Payer: BLUE CROSS/BLUE SHIELD

## 2015-11-19 ENCOUNTER — Inpatient Hospital Stay: Payer: BLUE CROSS/BLUE SHIELD | Attending: Internal Medicine | Admitting: Internal Medicine

## 2015-11-19 ENCOUNTER — Ambulatory Visit: Payer: BLUE CROSS/BLUE SHIELD | Admitting: *Deleted

## 2015-11-19 VITALS — BP 127/84 | HR 70 | Temp 97.9°F | Resp 18 | Wt 224.0 lb

## 2015-11-19 DIAGNOSIS — M129 Arthropathy, unspecified: Secondary | ICD-10-CM

## 2015-11-19 DIAGNOSIS — Z801 Family history of malignant neoplasm of trachea, bronchus and lung: Secondary | ICD-10-CM | POA: Diagnosis not present

## 2015-11-19 DIAGNOSIS — N400 Enlarged prostate without lower urinary tract symptoms: Secondary | ICD-10-CM | POA: Insufficient documentation

## 2015-11-19 DIAGNOSIS — Z7901 Long term (current) use of anticoagulants: Secondary | ICD-10-CM | POA: Insufficient documentation

## 2015-11-19 DIAGNOSIS — I10 Essential (primary) hypertension: Secondary | ICD-10-CM | POA: Diagnosis not present

## 2015-11-19 DIAGNOSIS — Z87891 Personal history of nicotine dependence: Secondary | ICD-10-CM | POA: Diagnosis not present

## 2015-11-19 DIAGNOSIS — G4730 Sleep apnea, unspecified: Secondary | ICD-10-CM | POA: Diagnosis not present

## 2015-11-19 DIAGNOSIS — Z800 Family history of malignant neoplasm of digestive organs: Secondary | ICD-10-CM | POA: Diagnosis not present

## 2015-11-19 DIAGNOSIS — I824Z2 Acute embolism and thrombosis of unspecified deep veins of left distal lower extremity: Secondary | ICD-10-CM | POA: Insufficient documentation

## 2015-11-19 DIAGNOSIS — M779 Enthesopathy, unspecified: Secondary | ICD-10-CM

## 2015-11-19 DIAGNOSIS — Z86718 Personal history of other venous thrombosis and embolism: Secondary | ICD-10-CM | POA: Insufficient documentation

## 2015-11-19 DIAGNOSIS — Z79899 Other long term (current) drug therapy: Secondary | ICD-10-CM | POA: Insufficient documentation

## 2015-11-19 DIAGNOSIS — D6859 Other primary thrombophilia: Secondary | ICD-10-CM

## 2015-11-19 LAB — BASIC METABOLIC PANEL
Anion gap: 6 (ref 5–15)
BUN: 17 mg/dL (ref 6–20)
CALCIUM: 8.8 mg/dL — AB (ref 8.9–10.3)
CO2: 28 mmol/L (ref 22–32)
CREATININE: 0.66 mg/dL (ref 0.61–1.24)
Chloride: 103 mmol/L (ref 101–111)
GFR calc non Af Amer: >60 mL/min (ref >60)
Glucose, Bld: 89 mg/dL (ref 65–99)
Potassium: 4.1 mmol/L (ref 3.5–5.1)
Sodium: 137 mmol/L (ref 135–145)

## 2015-11-19 LAB — CBC WITH DIFFERENTIAL/PLATELET
BASOS PCT: 1 %
Basophils Absolute: 0.1 10*3/uL (ref 0–0.1)
EOS ABS: 0.1 10*3/uL (ref 0–0.7)
EOS PCT: 2 %
HEMATOCRIT: 44.1 % (ref 40.0–52.0)
Hemoglobin: 15.1 g/dL (ref 13.0–18.0)
Lymphocytes Relative: 18 %
Lymphs Abs: 1 10*3/uL (ref 1.0–3.6)
MCH: 31.4 pg (ref 26.0–34.0)
MCHC: 34.4 g/dL (ref 32.0–36.0)
MCV: 91.3 fL (ref 80.0–100.0)
MONO ABS: 0.7 10*3/uL (ref 0.2–1.0)
MONOS PCT: 12 %
NEUTROS ABS: 3.8 10*3/uL (ref 1.4–6.5)
Neutrophils Relative %: 67 %
Platelets: 190 10*3/uL (ref 150–440)
RBC: 4.82 MIL/uL (ref 4.40–5.90)
RDW: 14 % (ref 11.5–14.5)
WBC: 5.7 10*3/uL (ref 3.8–10.6)

## 2015-11-19 NOTE — Progress Notes (Signed)
Patient state he has finished his Xarelto.

## 2015-11-19 NOTE — Assessment & Plan Note (Addendum)
Recurrent DVT- unprovoked left lower extremity [June 2016] and unprovoked right lower extremity [September 2016] while on Coumadin [2 weeks prior INR was 3.4]. Prothrombin gene mutation/factor V Leiden negative. Patient finished anticoagulations for 6 months with Xarelto [July 2017]. If patient has a repeated blood clot he likely need lifelong anticoagulation.   # I recommend baby aspirin once a day.  # Left lower extremity- mild postphlebitic syndrome- recommend stockings/leg elevation.   # Follow up as needed.

## 2015-11-19 NOTE — Patient Instructions (Signed)

## 2015-11-19 NOTE — Progress Notes (Signed)
Patient ID: Ruben [name], male   DOB: [birth year], 60 y.o.   MRN: [id number] Patient presents for orthotic pick up.  Verbal and written break in and wear instructions given.  Patient will follow up in 4 weeks if symptoms worsen or fail to improve.

## 2015-11-19 NOTE — Progress Notes (Signed)
[facility] CONSULT NOTE  Patient Care Team: Sula Rumple, MD as PCP - General (Family Medicine)  HEMATOLOGY HISTORY:  # June 2106- LEFT LE [ above Knee; arthroscopic Sx] on Coumadin; SEP 2016- R LE DVT [on coumadin sub-therapeutic]  S/p IVC Filter [Dr.Dew] SEP 2016- START XARELTO ;  STOPPED July 2017; NEG factor V Leiden/Prothrombin gene.   HISTORY OF PRESENTING ILLNESS:  Ruben [name] 60 y.o. male above history of likely unprovoked DVT of the left lower extremity and then repeat DVT on the right lower extremity while on Coumadin; subtherapeutic currently on Xarelto since September 2016 is here for follow-up. Patient stopped Xarelto earlier in the month- after 6 months of anticoagulation  Patient continues to complain of swelling in his legs mostly on the ankles especially with standing for long hours. He is currently using graduated stocking-seems to be helping. He denies any new blood clots. Denies any new history of DVT or PEs.   He does not plan to have any gastric sleeve surgery.  ROS: A complete 10 point review of system is done which is negative except mentioned above in history of present illness  MEDICAL HISTORY:  Past Medical History  Diagnosis Date  . Sleep apnea   . Hypertension   . Arthritis   . Restless leg   . Right leg DVT (HCC) 01/2015    while on anticoagulation  . Varicose veins   . Left leg DVT (HCC) 10/2014    after right knee surgery had DVT in left leg  . Cataract   . BPH (benign prostatic hyperplasia)     SURGICAL HISTORY: Past Surgical History  Procedure Laterality Date  . Knee arthroplasty Left 2005, 1983  . Elbow arthroscopy Bilateral   . Knee arthroscopy Right 09/16/2014    Procedure: ARTHROSCOPY KNEE-partial menisectomy;  Surgeon: Donato Heinz, MD;  Location: [facility] ORS;  Service: Orthopedics;  Laterality: Right;  . Neck surgery      cervical fusion  . Peripheral vascular catheterization [name] 01/29/2015    Procedure: IVC  Filter Insertion;  Surgeon: Annice Needy, MD;  Location: [facility] INVASIVE CV LAB;  Service: Cardiovascular;  Laterality: [name];    SOCIAL HISTORY: Social History   Social History  . Marital Status: Married    Spouse Name: [name]  . Number of Children: [name]  . Years of Education: [name]   Occupational History  . Not on file.   Social History Main Topics  . Smoking status: Former Smoker -- 1.00 packs/day for 10 years    Types: Cigarettes  . Smokeless tobacco: Former Neurosurgeon  . Alcohol Use: 0.6 oz/week    0 Standard drinks or equivalent, 1 Shots of liquor per week     Comment: occ  . Drug Use: No  . Sexual Activity: Not on file   Other Topics Concern  . Not on file   Social History Narrative    FAMILY HISTORY: Family History  Problem Relation Age of Onset  . Lung cancer Mother 24    with brain mets  . Liver cancer Father 33    hepatocellular cancer  . Other Father 21    carcinogeneticsarcoma of the mouth  . Myelodysplastic syndrome Paternal Uncle   . Deep vein thrombosis Paternal Uncle     dvt after surgery  . Heart disease    . Hypertension    . Liver disease      ALLERGIES:  is allergic to aleve; diclofenac; diclofenac-misoprostol; ibuprofen; and lodine.  MEDICATIONS:  Current Outpatient Prescriptions  Medication Sig Dispense Refill  . baclofen (LIORESAL) 10 MG tablet Take 10 mg by mouth 2 (two) times daily at 10 AM and 5 PM.     . HYDROcodone-acetaminophen (NORCO/VICODIN) 5-325 MG per tablet Take 1 tablet by mouth every 6 (six) hours as needed for moderate pain.    [name]. lisinopril-hydrochlorothiazide (PRINZIDE,ZESTORETIC) 10-12.5 MG per tablet Take 1 tablet by mouth daily.    . Melatonin 3 MG CAPS Take by mouth as needed. Reported on 05/22/2015    . omeprazole (PRILOSEC) 20 MG capsule Take 20 mg by mouth daily.    . pregabalin (LYRICA) 75 MG capsule Take 150 mg by mouth 2 (two) times daily.     . rivaroxaban (XARELTO) 20 MG TABS tablet Take 1 tablet (20 mg total) by mouth daily  with supper. 90 tablet 0  . Saw Palmetto 1000 MG CAPS Take 1 capsule by mouth daily.    . Tamsulosin HCl (FLOMAX PO) Take 4 mg by mouth daily.     [name]. zolpidem (AMBIEN) 10 MG tablet Take 10 mg by mouth at bedtime as needed for sleep.     No current facility-administered medications for this visit.      [name].  PHYSICAL EXAMINATION: ECOG PERFORMANCE STATUS: 1 - Symptomatic but completely ambulatory  Filed Vitals:   11/19/15 1044  BP: 127/84  Pulse: 70  Temp: 97.9 F (36.6 C)  Resp: 18   Filed Weights   11/19/15 1044  Weight: 224 lb (101.606 kg)    GENERAL: Well-nourished well-developed; Alert, no distress and comfortable.   He is alone. EYES: no pallor or icterus OROPHARYNX: no thrush or ulceration; good dentition  NECK: supple, no masses felt LYMPH:  no palpable lymphadenopathy in the cervical, axillary or inguinal regions LUNGS: clear to auscultation and  No wheeze or crackles HEART/CVS: regular rate & rhythm and no murmurs; LEFT LE- slight swollen compared to R LE ABDOMEN: abdomen soft, non-tender and normal bowel sounds Musculoskeletal:no cyanosis of digits and no clubbing  PSYCH: alert & oriented x 3 with fluent speech NEURO: no focal motor/sensory deficits SKIN:  no rashes or significant lesions  LABORATORY DATA:  I have reviewed the data as listed Lab Results  Component Value Date   WBC 5.7 11/19/2015   HGB 15.1 11/19/2015   HCT 44.1 11/19/2015   MCV 91.3 11/19/2015   PLT 190 11/19/2015    Recent Labs  02/03/15 1229 05/22/15 1020 11/19/15 1015  NA 134*  --  137  K 4.0  --  4.1  CL 101  --  103  CO2 27  --  28  GLUCOSE 96  --  89  BUN 19  --  17  CREATININE 0.62 0.77 0.66  CALCIUM 8.6*  --  8.8*  GFRNONAA >60 >60 >60  GFRAA >60 >60 >60  PROT 7.1 6.7  --   ALBUMIN 4.0 3.8  --   AST 19 22  --   ALT 22 24  --   ALKPHOS 60 65  --   BILITOT 0.7 0.8  --   BILIDIR  --  0.1  --   IBILI  --  0.7  --     RADIOGRAPHIC STUDIES: I have personally reviewed  the radiological images as listed and agreed with the findings in the report. No results found.  ASSESSMENT & PLAN:  Acute deep vein thrombosis (DVT) of distal vein of left lower extremity (HCC) Recurrent DVT- unprovoked left lower extremity [June 2016] and unprovoked  right lower extremity [September 2016] while on Coumadin [2 weeks prior INR was 3.4]. Prothrombin gene mutation/factor V Leiden negative. Patient finished anticoagulations for 6 months with Xarelto [July 2017]. If patient has a repeated blood clot he likely need lifelong anticoagulation.   # I recommend baby aspirin once a day.  # Left lower extremity- mild postphlebitic syndrome- recommend stockings/leg elevation.   # Follow up as needed.         Earna Coder, MD 11/19/2015 11:31 AM

## 2015-12-06 IMAGING — US US EXTREM LOW VENOUS*L*
1 series · 13 of 24 positions shown · non-contrast
Comparison: None.

CLINICAL DATA: Left lower extremity pain and swelling for the past
24 hours. Evaluate for DVT.



[Series 1: us extrem low venous*left* · 0.08mm/px · 13 of 33 slices shown]
[im 1/33]
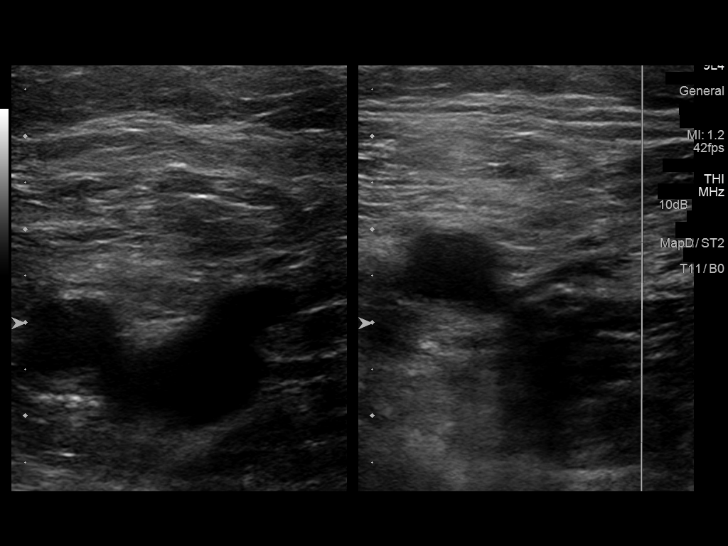
[im 3/33]
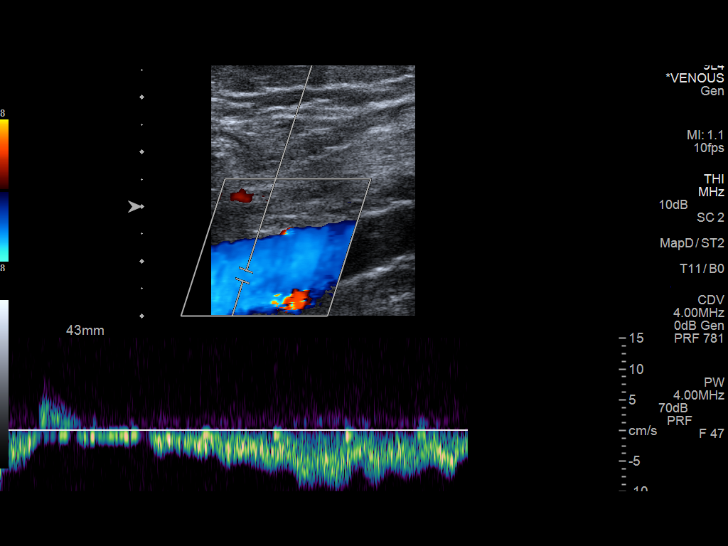
[im 6/33]
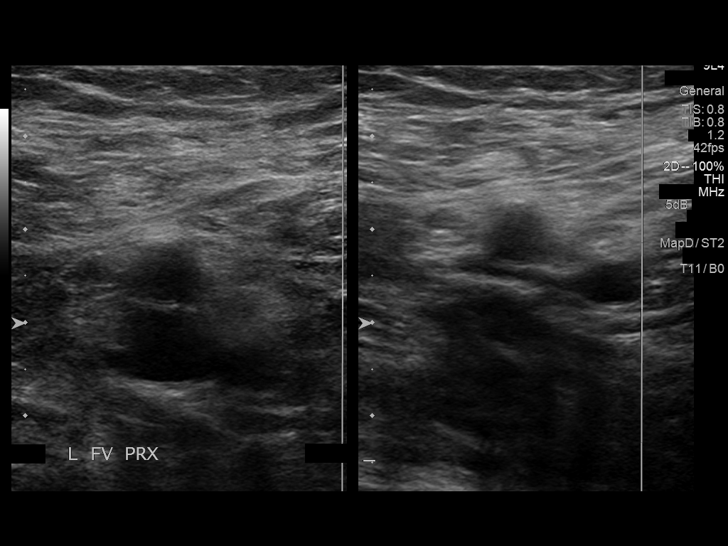
[im 9/33]
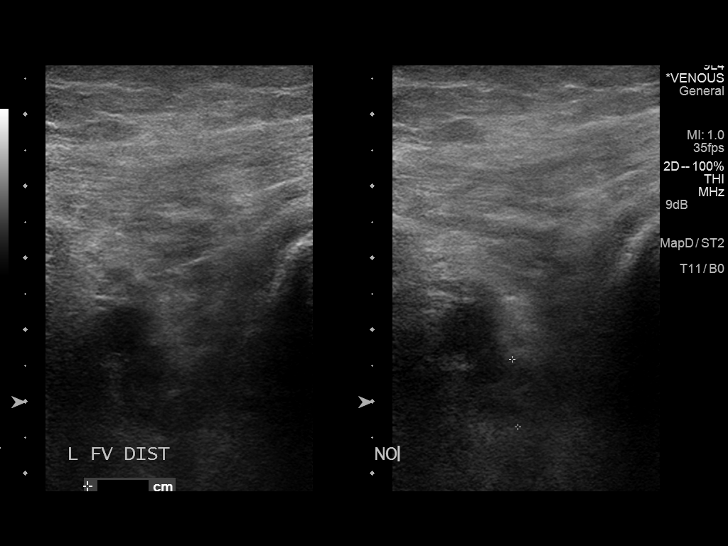
[im 12/33]
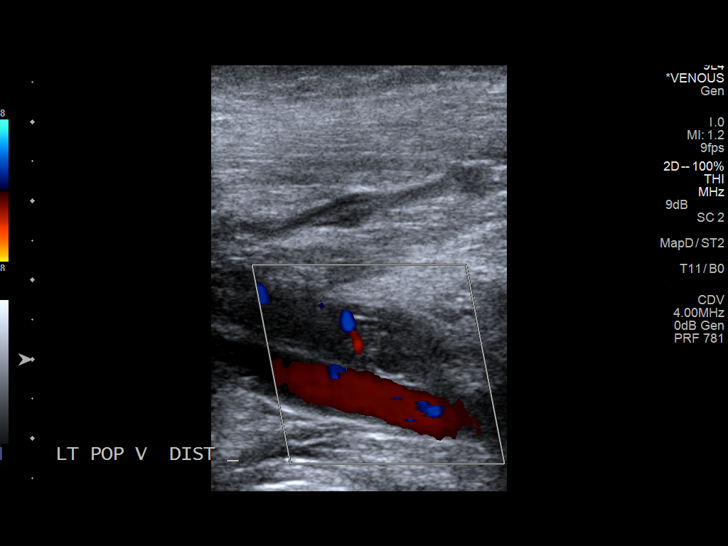
[im 14/33]
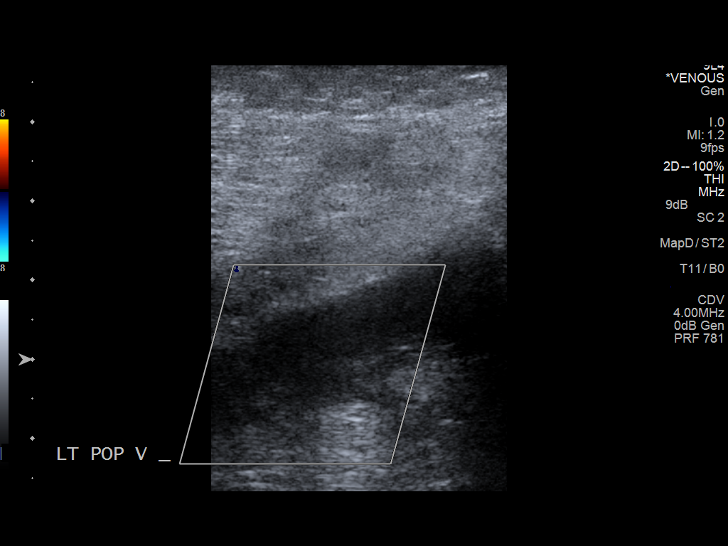
[im 17/33]
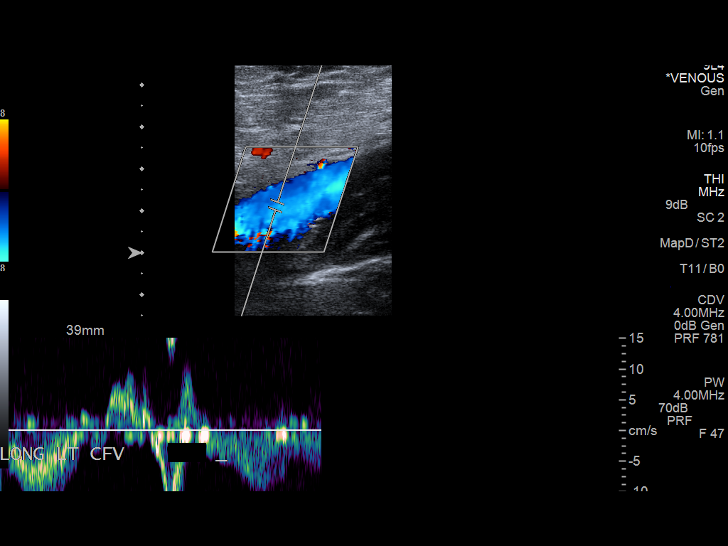
[im 19/33]
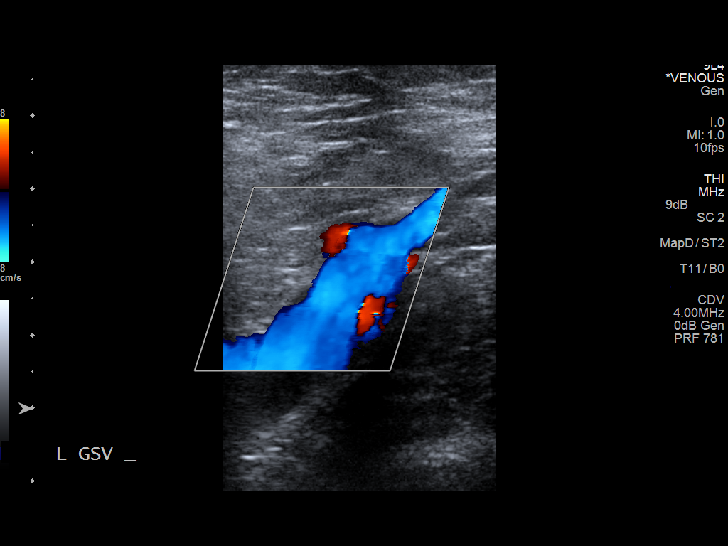
[im 21/33]
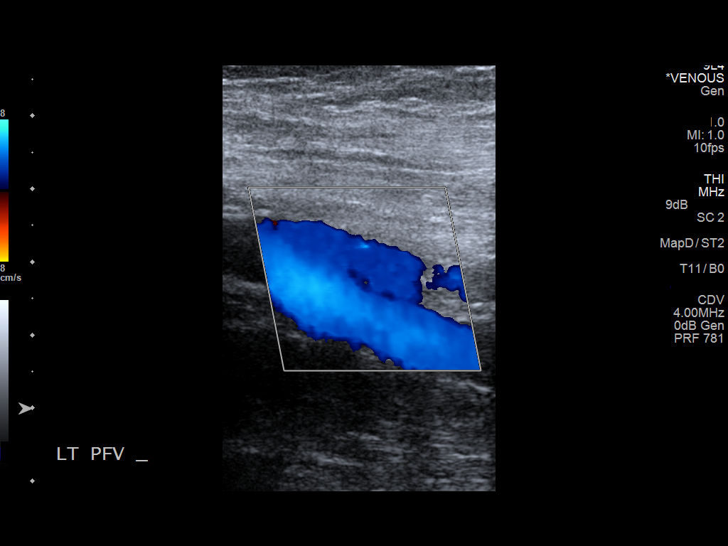
[im 24/33]
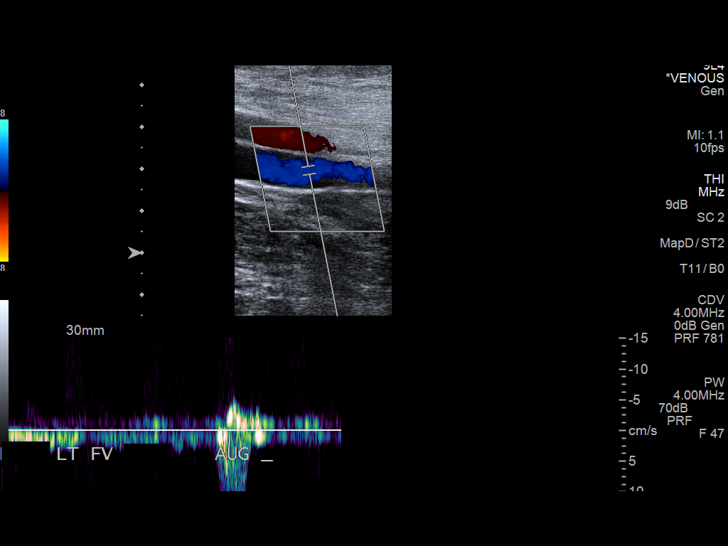
[im 27/33]
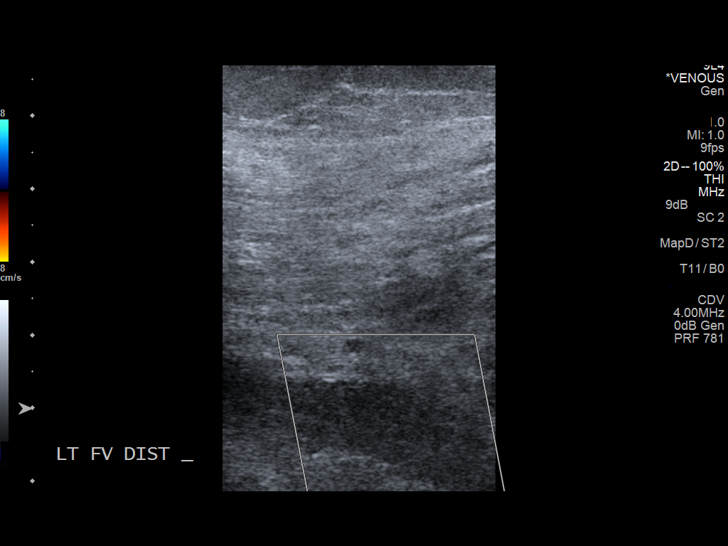
[im 30/33]
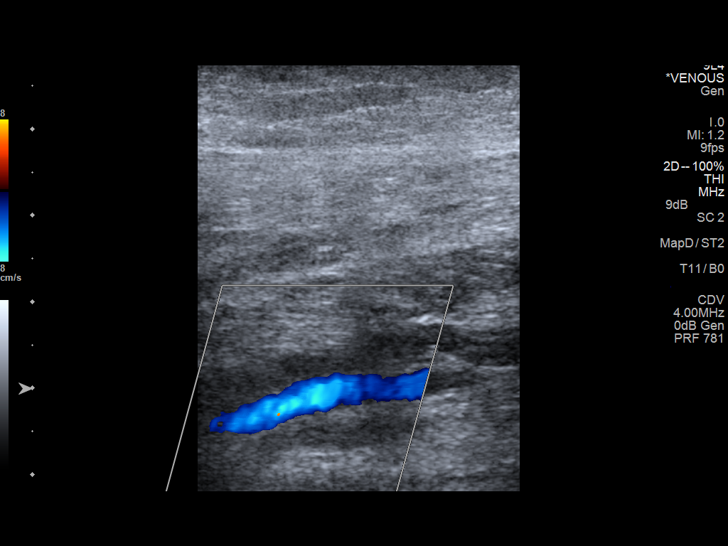
[im 33/33]
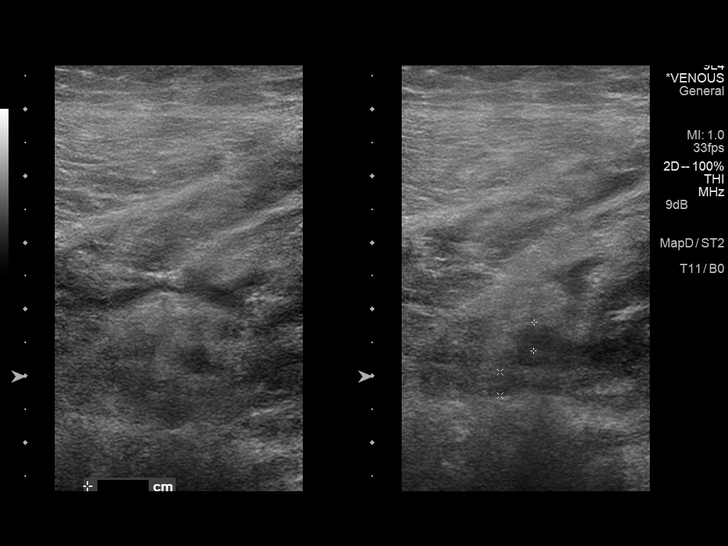

[13 of 24 positions shown; findings below may reference images not displayed]

FINDINGS: Contralateral Common Femoral Vein: Respiratory phasicity is normal
and symmetric with the symptomatic side. No evidence of thrombus.
Normal compressibility.

Common Femoral Vein: No evidence of thrombus. Normal
compressibility, respiratory phasicity and response to augmentation.

Saphenofemoral Junction: No evidence of thrombus. Normal
compressibility and flow on color Doppler imaging.

Profunda Femoral Vein: No evidence of thrombus. Normal
compressibility and flow on color Doppler imaging.

Femoral Vein: There is hypoechoic slightly expansile occlusive
thrombus seen within the distal aspect of the left femoral vein
(images 10 and 29).

Popliteal Vein: There is hypoechoic expansile occlusive thrombus
throughout the popliteal vein (image 11) and extending into the
distal duplication (image 12).

Calf Veins: There is hypoechoic slightly expansile thrombus within
both of the paired left posterior tibial veins (image 30 and 31.
There is hypoechoic slightly expansile thrombus within both of the
paired left pole peroneal veins (images 32 through 34).

Superficial Great Saphenous Vein: No evidence of thrombus. Normal
compressibility and flow on color Doppler imaging.

Venous Reflux:  None.

Other Findings:  None.
IMPRESSION: Examination positive for occlusive DVT extending from the distal
aspect of the left femoral vein through the popliteal vein to
involve both paired left posterior tibial and peroneal veins.

These results will be called to the ordering clinician or
representative by the Radiologist Assistant, and communication
documented in the PACS or zVision Dashboard.

## 2016-02-25 ENCOUNTER — Ambulatory Visit (INDEPENDENT_AMBULATORY_CARE_PROVIDER_SITE_OTHER): Payer: BLUE CROSS/BLUE SHIELD

## 2016-02-25 ENCOUNTER — Ambulatory Visit (INDEPENDENT_AMBULATORY_CARE_PROVIDER_SITE_OTHER): Payer: BLUE CROSS/BLUE SHIELD | Admitting: Podiatry

## 2016-02-25 DIAGNOSIS — M779 Enthesopathy, unspecified: Secondary | ICD-10-CM

## 2016-02-25 DIAGNOSIS — M79671 Pain in right foot: Secondary | ICD-10-CM | POA: Diagnosis not present

## 2016-02-25 MED ORDER — TRIAMCINOLONE ACETONIDE 10 MG/ML IJ SUSP
10.0000 mg | Freq: Once | INTRAMUSCULAR | Status: AC
  Administered 2016-02-25: 10 mg

## 2016-02-26 NOTE — Progress Notes (Signed)
Subjective:     Patient ID: Ruben [name], male   DOB: May 29, 1955, 60 y.o.   MRN: [id number]  HPI patient presents stating that the orthotics feel slightly high but he is getting pain on the outside of his right heel that is bothersome   Review of Systems     Objective:   Physical Exam Neurovascular status intact with pain in the plantar lateral aspect of the right heel with orthotics which are soft and are slightly higher but should come down with where usage    Assessment:     Tendinitis plantar fascial right with orthotics which may need to be adjusted    Plan:     Injected the right plantar lateral heel 3 mg Kenalog 5 mg Xylocaine and went ahead and discussed possible changes and orthotics if they continue to feel too high but to give it at least 4-6 weeks. Reappoint as needed

## 2016-03-17 ENCOUNTER — Encounter: Payer: Self-pay | Admitting: Podiatry

## 2016-03-17 ENCOUNTER — Ambulatory Visit (INDEPENDENT_AMBULATORY_CARE_PROVIDER_SITE_OTHER): Payer: BLUE CROSS/BLUE SHIELD | Admitting: Podiatry

## 2016-03-17 ENCOUNTER — Ambulatory Visit (INDEPENDENT_AMBULATORY_CARE_PROVIDER_SITE_OTHER): Payer: BLUE CROSS/BLUE SHIELD

## 2016-03-17 DIAGNOSIS — M7662 Achilles tendinitis, left leg: Secondary | ICD-10-CM | POA: Diagnosis not present

## 2016-03-17 DIAGNOSIS — M722 Plantar fascial fibromatosis: Secondary | ICD-10-CM

## 2016-03-17 DIAGNOSIS — M79672 Pain in left foot: Secondary | ICD-10-CM | POA: Diagnosis not present

## 2016-03-17 MED ORDER — TRIAMCINOLONE ACETONIDE 10 MG/ML IJ SUSP
10.0000 mg | Freq: Once | INTRAMUSCULAR | Status: AC
  Administered 2016-03-17: 10 mg

## 2016-03-18 NOTE — Progress Notes (Signed)
Subjective:     Patient ID: Ruben [name], male   DOB: Oct 29, 1955, 60 y.o.   MRN: [id number]  HPI patient presents stating I'm developing chronic plantar fasciitis in his right heel and I also started develop pain again in the back of my left heel that's making ambulation difficult   Review of Systems     Objective:   Physical Exam Neurovascular status intact with inflammatory changes plantar aspect left which seem to move with slight involvement of bone and posterior pain left heel medial side localized in nature with good strength of the tendon and no central or lateral involvement    Assessment:     What appears to be acute plantar fasciitis even though I can't rule out stress fracture right and Achilles tendinitis left    Plan:     Discussed for the right it would probably be best to go ahead with shockwave therapy as were not able to achieve results with cortisone. Or other modalities that have been tried. Scheduled for shockwave after the first the year and for the left Achilles I carefully did medial injection first explaining chances for rupture associated with injection. I used half cc dexamethasone Kenalog and cc of Xylocaine Marcaine which was tolerated well  X-ray report indicates that there is spurring plantar right but I did not note any signs of stress fracture and left just shows mild posterior spurring

## 2016-03-24 ENCOUNTER — Ambulatory Visit (INDEPENDENT_AMBULATORY_CARE_PROVIDER_SITE_OTHER): Payer: BLUE CROSS/BLUE SHIELD | Admitting: Podiatry

## 2016-03-24 ENCOUNTER — Encounter: Payer: Self-pay | Admitting: Podiatry

## 2016-03-24 VITALS — BP 114/64 | HR 81 | Resp 16

## 2016-03-24 DIAGNOSIS — M722 Plantar fascial fibromatosis: Secondary | ICD-10-CM

## 2016-03-24 NOTE — Progress Notes (Signed)
Subjective:     Patient ID: Ruben [name], male   DOB: 1956/01/16, 60 y.o.   MRN: [id number]  HPI patient presents and is very painful underneath the right foot stating that there is one spot that seems different   Review of Systems     Objective:   Physical Exam Neurovascular status intact with one area of inflammation center of the right plantar fascia with fluid buildup noted    Assessment:     Plantar fasciitis slightly different position than before right    Plan:     Reviewed condition and at this time went ahead and injected the one area 3 mg Kenalog 5 mill grams Xylocaine advised on physical therapy and we will start shockwave therapy on this patient for the left Achilles tendon

## 2016-03-31 ENCOUNTER — Other Ambulatory Visit: Payer: BLUE CROSS/BLUE SHIELD

## 2016-04-01 ENCOUNTER — Ambulatory Visit (INDEPENDENT_AMBULATORY_CARE_PROVIDER_SITE_OTHER): Payer: BLUE CROSS/BLUE SHIELD | Admitting: Podiatry

## 2016-04-01 DIAGNOSIS — M722 Plantar fascial fibromatosis: Secondary | ICD-10-CM

## 2016-04-01 DIAGNOSIS — M779 Enthesopathy, unspecified: Secondary | ICD-10-CM

## 2016-04-01 NOTE — Progress Notes (Signed)
   Subjective:    Patient ID: Ruben [name], male    DOB: [birth year], 60 y.o.   MRN: [id number]  HPI  Pt presents with significant pain in his right heel, ongoing for a few months Review of Systems All other systems negative    Objective:   Physical Exam  Pain on palpation of Rt heel, with noted swelling and knot at the medial band      Assessment & Plan:  ESWT administered to Rt heel at 20 joules for 3000 pulses. EPAT therapy administered to surrounding connective tissue for 3000 pulses. Advised on boot usage and avoidance of NSAIDS. Re-appointed in 1 week for 2nd treatment

## 2016-04-07 ENCOUNTER — Ambulatory Visit (INDEPENDENT_AMBULATORY_CARE_PROVIDER_SITE_OTHER): Payer: BLUE CROSS/BLUE SHIELD

## 2016-04-07 DIAGNOSIS — M7662 Achilles tendinitis, left leg: Secondary | ICD-10-CM

## 2016-04-07 DIAGNOSIS — M722 Plantar fascial fibromatosis: Secondary | ICD-10-CM

## 2016-04-08 NOTE — Progress Notes (Signed)
   Subjective:    Patient ID: Ruben [name], male    DOB: Aug 04, 1955, 60 y.o.   MRN: [id number]  HPI  Pt presents with significant pain in his right heel, ongoing for a few months, stating that he has noticed an improvement in his foot pain since beginning this treatment Review of Systems All other systems negative    Objective:   Physical Exam  Mild pain on palpation of medial heel band      Assessment & Plan:  ESWT administered to Rt heel at 26 joules for 3000 pulses. EPAT therapy administered to surrounding connective tissue for 3000 pulses. Advised on boot usage and avoidance of NSAIDS. Re-appointed in 1 week for 3rd treatment

## 2016-04-14 ENCOUNTER — Other Ambulatory Visit (INDEPENDENT_AMBULATORY_CARE_PROVIDER_SITE_OTHER): Payer: BLUE CROSS/BLUE SHIELD

## 2016-04-14 ENCOUNTER — Other Ambulatory Visit: Payer: BLUE CROSS/BLUE SHIELD

## 2016-04-21 ENCOUNTER — Other Ambulatory Visit: Payer: BLUE CROSS/BLUE SHIELD

## 2016-05-05 ENCOUNTER — Other Ambulatory Visit: Payer: BLUE CROSS/BLUE SHIELD

## 2016-05-12 ENCOUNTER — Other Ambulatory Visit: Payer: BLUE CROSS/BLUE SHIELD

## 2016-05-19 ENCOUNTER — Other Ambulatory Visit: Payer: BLUE CROSS/BLUE SHIELD

## 2016-05-24 ENCOUNTER — Ambulatory Visit: Payer: BLUE CROSS/BLUE SHIELD

## 2016-05-24 DIAGNOSIS — M7662 Achilles tendinitis, left leg: Secondary | ICD-10-CM

## 2016-05-26 ENCOUNTER — Other Ambulatory Visit: Payer: BLUE CROSS/BLUE SHIELD

## 2016-05-26 NOTE — Progress Notes (Signed)
   Subjective:    Patient ID: Ruben [name], male    DOB: 05/16/1955, 61 y.o.   MRN: [id number]  HPI  Pt presents stating that the pain in his Rt heel has completely improved, but he is currently having pain on his Lt foot posterior tendon, pt has h/o of injury to that area Review of Systems All other systems negative    Objective:   Physical Exam  No pain to Rt heel with deep palpation. Pain on palpation to Lt posterior tendon 7 of 10      Assessment & Plan:  ESWT administered to Rt heel at 21 joules for 3000 pulses. EPAT therapy administered to surrounding connective tissue for 3000 pulses. Advised on boot usage and avoidance of NSAIDS. Re-appointed in 6 weeks to see Dr Charlsie Merlesegal, sooner if symptoms worsen in Lt foot

## 2016-06-09 DIAGNOSIS — G8929 Other chronic pain: Secondary | ICD-10-CM | POA: Insufficient documentation

## 2016-06-15 HISTORY — PX: WRIST ARTHROSCOPY WITH CARPOMETACARPEL (CMC) ARTHROPLASTY: SHX5680

## 2016-06-30 DIAGNOSIS — M19049 Primary osteoarthritis, unspecified hand: Secondary | ICD-10-CM | POA: Insufficient documentation

## 2016-06-30 DIAGNOSIS — G5601 Carpal tunnel syndrome, right upper limb: Secondary | ICD-10-CM | POA: Insufficient documentation

## 2016-07-07 ENCOUNTER — Encounter: Payer: Self-pay | Admitting: Podiatry

## 2016-07-07 ENCOUNTER — Ambulatory Visit (INDEPENDENT_AMBULATORY_CARE_PROVIDER_SITE_OTHER): Payer: BLUE CROSS/BLUE SHIELD | Admitting: Podiatry

## 2016-07-07 DIAGNOSIS — M722 Plantar fascial fibromatosis: Secondary | ICD-10-CM | POA: Diagnosis not present

## 2016-07-07 DIAGNOSIS — M779 Enthesopathy, unspecified: Secondary | ICD-10-CM | POA: Diagnosis not present

## 2016-07-07 MED ORDER — TRIAMCINOLONE ACETONIDE 10 MG/ML IJ SUSP
10.0000 mg | Freq: Once | INTRAMUSCULAR | Status: AC
  Administered 2016-07-07: 10 mg

## 2016-07-07 NOTE — Progress Notes (Signed)
Subjective:     Patient ID: Ruben [name], male   DOB: April 08, 1956, 61 y.o.   MRN: [id number]  HPI patient presents stating I'm having pain in my right forefoot and then in my left foot I'm having pain in the arch. States that the area we did the shockwave is doing well   Review of Systems     Objective:   Physical Exam Neurovascular status intact with patient found have inflammation right second MPJ and in the plantar fascial left in the mid arch region with fluid buildup noted    Assessment:     Inflammatory capsulitis right with mid arch fasciitis left    Plan:     H&P all conditions discussed and cortisone capsular injection administered 3 mg X met the sewn Kenalog right and 3 mg Kenalog Xylocaine left which were tolerated well

## 2016-09-15 ENCOUNTER — Encounter: Payer: Self-pay | Admitting: Podiatry

## 2016-09-15 ENCOUNTER — Ambulatory Visit (INDEPENDENT_AMBULATORY_CARE_PROVIDER_SITE_OTHER): Payer: BLUE CROSS/BLUE SHIELD | Admitting: Podiatry

## 2016-09-15 DIAGNOSIS — M722 Plantar fascial fibromatosis: Secondary | ICD-10-CM | POA: Diagnosis not present

## 2016-09-15 DIAGNOSIS — M7662 Achilles tendinitis, left leg: Secondary | ICD-10-CM

## 2016-09-15 MED ORDER — TRIAMCINOLONE ACETONIDE 10 MG/ML IJ SUSP
10.0000 mg | Freq: Once | INTRAMUSCULAR | Status: AC
  Administered 2016-09-15: 10 mg

## 2016-09-16 NOTE — Progress Notes (Signed)
Subjective:    Patient ID: Ruben [name], male   DOB: 61 y.o.   MRN: [id number]   HPI patient states I have one spot on my left heel that's been really sore and other than that I seem to be okay    ROS      Objective:  Physical Exam Neurovascular status intact with a more proximal inflammation of the plantar fascia underneath the calcaneus with pain when palpated    Assessment:   Plantar fasciitis left of the more proximal portion with the distal doing well from previous treatment      Plan:     Careful steroidal injection administered 3 mg Kenalog 5 mg Xylocaine advised on reduced activity and reappoint to recheck

## 2017-07-13 ENCOUNTER — Ambulatory Visit (INDEPENDENT_AMBULATORY_CARE_PROVIDER_SITE_OTHER): Payer: BLUE CROSS/BLUE SHIELD

## 2017-07-13 ENCOUNTER — Encounter: Payer: Self-pay | Admitting: Podiatry

## 2017-07-13 ENCOUNTER — Other Ambulatory Visit: Payer: Self-pay | Admitting: Podiatry

## 2017-07-13 ENCOUNTER — Ambulatory Visit: Payer: BLUE CROSS/BLUE SHIELD | Admitting: Podiatry

## 2017-07-13 DIAGNOSIS — M779 Enthesopathy, unspecified: Secondary | ICD-10-CM

## 2017-07-13 DIAGNOSIS — M722 Plantar fascial fibromatosis: Secondary | ICD-10-CM

## 2017-07-13 DIAGNOSIS — M79671 Pain in right foot: Secondary | ICD-10-CM

## 2017-07-13 MED ORDER — TRIAMCINOLONE ACETONIDE 10 MG/ML IJ SUSP
10.0000 mg | Freq: Once | INTRAMUSCULAR | Status: AC
  Administered 2017-07-13: 10 mg

## 2017-07-13 NOTE — Progress Notes (Signed)
Subjective:   Patient ID: Ruben [name], male   DOB: 62 y.o.   MRN: [id number]   HPI Patient states he is having problems with both his feet with the second toe joint right having become very inflamed and sore in the left heel being moderately tender still with palpation neurovascular status unchanged with inflammation pain of the second MPJ right with acute nature to condition plantar fasciitis left   ROS      Objective:  Physical Exam  Present     Assessment:  I reviewed inflammatory capsulitis second MPJ right and also plantar fasciitis left educating her on both conditions     Plan:  H&P conditions reviewed and at this time I am going to focus on both conditions.  For the right of the proximal nerve block aspirated the joint getting out a small amount of clear fluid and injected quarter cc dexamethasone Kenalog to reduce the inflammation and for the left I injected the plantar fascia 3 mg Kenalog 5 mg Xylocaine which was x-rays indicated no indication of fracture of the right second metatarsal with good alignment noted and the left heel has small spur formation but no other pathology

## 2018-05-26 ENCOUNTER — Other Ambulatory Visit: Payer: Self-pay | Admitting: Family Medicine

## 2018-05-26 DIAGNOSIS — R05 Cough: Secondary | ICD-10-CM

## 2018-05-26 DIAGNOSIS — E119 Type 2 diabetes mellitus without complications: Secondary | ICD-10-CM

## 2018-05-26 DIAGNOSIS — R053 Chronic cough: Secondary | ICD-10-CM

## 2018-06-14 ENCOUNTER — Ambulatory Visit
Admission: RE | Admit: 2018-06-14 | Discharge: 2018-06-14 | Disposition: A | Discharge status: Home / Self Care | Payer: BLUE CROSS/BLUE SHIELD | Source: Ambulatory Visit | Attending: Family Medicine | Admitting: Family Medicine

## 2018-06-14 DIAGNOSIS — R05 Cough: Secondary | ICD-10-CM | POA: Diagnosis not present

## 2018-06-14 DIAGNOSIS — E119 Type 2 diabetes mellitus without complications: Secondary | ICD-10-CM | POA: Diagnosis present

## 2018-06-14 DIAGNOSIS — R053 Chronic cough: Secondary | ICD-10-CM

## 2018-06-27 ENCOUNTER — Encounter: Payer: Self-pay | Admitting: *Deleted

## 2018-06-28 ENCOUNTER — Encounter: Payer: Self-pay | Admitting: *Deleted

## 2018-06-28 ENCOUNTER — Ambulatory Visit: Payer: BLUE CROSS/BLUE SHIELD | Admitting: Certified Registered"

## 2018-06-28 ENCOUNTER — Ambulatory Visit
Admission: RE | Admit: 2018-06-28 | Discharge: 2018-06-28 | Disposition: A | Discharge status: Home / Self Care | Payer: BLUE CROSS/BLUE SHIELD | Attending: Internal Medicine | Admitting: Internal Medicine

## 2018-06-28 ENCOUNTER — Encounter
Admission: RE | Disposition: A | Discharge status: Home / Self Care | Payer: Self-pay | Source: Home / Self Care | Attending: Internal Medicine

## 2018-06-28 DIAGNOSIS — Z1211 Encounter for screening for malignant neoplasm of colon: Secondary | ICD-10-CM | POA: Diagnosis not present

## 2018-06-28 DIAGNOSIS — K640 First degree hemorrhoids: Secondary | ICD-10-CM | POA: Diagnosis not present

## 2018-06-28 DIAGNOSIS — G2581 Restless legs syndrome: Secondary | ICD-10-CM | POA: Insufficient documentation

## 2018-06-28 DIAGNOSIS — I10 Essential (primary) hypertension: Secondary | ICD-10-CM | POA: Diagnosis not present

## 2018-06-28 DIAGNOSIS — Z7901 Long term (current) use of anticoagulants: Secondary | ICD-10-CM | POA: Diagnosis not present

## 2018-06-28 DIAGNOSIS — E669 Obesity, unspecified: Secondary | ICD-10-CM | POA: Diagnosis not present

## 2018-06-28 DIAGNOSIS — Z86718 Personal history of other venous thrombosis and embolism: Secondary | ICD-10-CM | POA: Insufficient documentation

## 2018-06-28 DIAGNOSIS — N400 Enlarged prostate without lower urinary tract symptoms: Secondary | ICD-10-CM | POA: Diagnosis not present

## 2018-06-28 DIAGNOSIS — G4730 Sleep apnea, unspecified: Secondary | ICD-10-CM | POA: Diagnosis not present

## 2018-06-28 DIAGNOSIS — Z6836 Body mass index (BMI) 36.0-36.9, adult: Secondary | ICD-10-CM | POA: Insufficient documentation

## 2018-06-28 DIAGNOSIS — Z79899 Other long term (current) drug therapy: Secondary | ICD-10-CM | POA: Insufficient documentation

## 2018-06-28 DIAGNOSIS — K5730 Diverticulosis of large intestine without perforation or abscess without bleeding: Secondary | ICD-10-CM | POA: Insufficient documentation

## 2018-06-28 HISTORY — PX: COLONOSCOPY WITH PROPOFOL: SHX5780

## 2018-06-28 HISTORY — DX: Obesity, unspecified: E66.9

## 2018-06-28 SURGERY — COLONOSCOPY WITH PROPOFOL
Anesthesia: General

## 2018-06-28 MED ORDER — PROPOFOL 500 MG/50ML IV EMUL
INTRAVENOUS | Status: AC
  Filled 2018-06-28: qty 50

## 2018-06-28 MED ORDER — LIDOCAINE HCL (CARDIAC) PF 100 MG/5ML IV SOSY
PREFILLED_SYRINGE | INTRAVENOUS | Status: DC | PRN
  Administered 2018-06-28: 60 mg via INTRATRACHEAL

## 2018-06-28 MED ORDER — LIDOCAINE HCL (PF) 2 % IJ SOLN
INTRAMUSCULAR | Status: AC
  Filled 2018-06-28: qty 10

## 2018-06-28 MED ORDER — PROPOFOL 500 MG/50ML IV EMUL
INTRAVENOUS | Status: DC | PRN
  Administered 2018-06-28: 100 ug/kg/min via INTRAVENOUS

## 2018-06-28 MED ORDER — PROPOFOL 10 MG/ML IV BOLUS
INTRAVENOUS | Status: DC | PRN
  Administered 2018-06-28: 40 mg via INTRAVENOUS
  Administered 2018-06-28: 50 mg via INTRAVENOUS
  Administered 2018-06-28: 20 mg via INTRAVENOUS
  Administered 2018-06-28: 30 mg via INTRAVENOUS

## 2018-06-28 MED ORDER — SODIUM CHLORIDE 0.9 % IV SOLN
INTRAVENOUS | Status: DC
  Administered 2018-06-28: 09:00:00 via INTRAVENOUS

## 2018-06-28 NOTE — Anesthesia Post-op Follow-up Note (Signed)
Anesthesia QCDR form completed.        

## 2018-06-28 NOTE — H&P (Signed)
Outpatient short stay form Pre-procedure 06/28/2018 9:34 AM  K. Norma Fredrickson, M.D.  Primary Physician: Maudie Flakes, M.D.  Reason for visit:  Colon cancer screening  History of present illness: 63 y/o male presents for colon cancer screening. Patient denies change in bowel habits, rectal bleeding, weight loss or abdominal pain.     Current Facility-Administered Medications:  .  0.9 %  sodium chloride infusion, , Intravenous, Continuous, [place], Boykin Nearing, MD, Last Rate: 20 mL/hr at 06/28/18 9702  Medications Prior to Admission  Medication Sig Dispense Refill Last Dose  . baclofen (LIORESAL) 10 MG tablet Take 10 mg by mouth 2 (two) times daily at 10 AM and 5 PM.    06/27/2018 at Unknown time  . losartan-hydrochlorothiazide (HYZAAR) 50-12.5 MG tablet Take 1 tablet by mouth daily.   06/28/2018 at Unknown time  . omeprazole (PRILOSEC) 20 MG capsule Take 20 mg by mouth daily.   06/27/2018 at Unknown time  . zolpidem (AMBIEN) 10 MG tablet Take 10 mg by mouth at bedtime as needed for sleep.   06/27/2018 at Unknown time  . HYDROcodone-acetaminophen (NORCO/VICODIN) 5-325 MG per tablet Take 1 tablet by mouth every 6 (six) hours as needed for moderate pain.   Not Taking at Unknown time  . lisinopril-hydrochlorothiazide (PRINZIDE,ZESTORETIC) 10-12.5 MG per tablet Take 1 tablet by mouth daily.   Not Taking at Unknown time  . Melatonin 3 MG CAPS Take by mouth as needed. Reported on 05/22/2015   Taking  . pregabalin (LYRICA) 75 MG capsule Take 150 mg by mouth 2 (two) times daily.    Not Taking at Unknown time  . rivaroxaban (XARELTO) 20 MG TABS tablet Take 1 tablet (20 mg total) by mouth daily with supper. (Patient not taking: Reported on 06/28/2018) 90 tablet 0 Completed Course at Unknown time  . Saw Palmetto 1000 MG CAPS Take 1 capsule by mouth daily.   Taking  . Tamsulosin HCl (FLOMAX PO) Take 4 mg by mouth daily.    Taking     Allergies  Allergen Reactions  . Aleve [Naproxen] Rash    Rectal  spasms  . Diclofenac Other (See Comments)    Rectal pain  . Diclofenac-Misoprostol Rash  . Ibuprofen Rash    Rectal spasm   . Lodine [Etodolac] Rash     Past Medical History:  Diagnosis Date  . Arthritis   . BPH (benign prostatic hyperplasia)   . Cataract   . Hypertension   . Left leg DVT (HCC) 10/2014   after right knee surgery had DVT in left leg  . Obesity   . Restless leg   . Right leg DVT (HCC) 01/2015   while on anticoagulation  . Sleep apnea   . Varicose veins     Review of systems:  Otherwise negative.    Physical Exam  Gen: Alert, oriented. Appears stated age.  HEENT: [place]/AT. PERRLA. Lungs: CTA, no wheezes. CV: RR nl S1, S2. Abd: soft, benign, no masses. BS+ Ext: No edema. Pulses 2+    Planned procedures: Proceed with colonoscopy. The patient understands the nature of the planned procedure, indications, risks, alternatives and potential complications including but not limited to bleeding, infection, perforation, damage to internal organs and possible oversedation/side effects from anesthesia. The patient agrees and gives consent to proceed.  Please refer to procedure notes for findings, recommendations and patient disposition/instructions.      K. Norma Fredrickson, M.D. Gastroenterology 06/28/2018  9:34 AM

## 2018-06-28 NOTE — Op Note (Signed)
[facility] Gastroenterology Patient Name: Ruben [name] Procedure Date: 06/28/2018 9:32 AM MRN: [id number] Account #: [id number] Date of Birth: November 21, 1955 Admit Type: Outpatient Age: 63 Room: [facility] ENDO ROOM 2 Gender: Male Note Status: Finalized Procedure:            Colonoscopy Indications:          Screening for colorectal malignant neoplasm Providers:            Boykin Nearing.  MD, MD Medicines:            Propofol per Anesthesia Complications:        No immediate complications. Procedure:            Pre-Anesthesia Assessment:                       - The risks and benefits of the procedure and the                        sedation options and risks were discussed with the                        patient. All questions were answered and informed                        consent was obtained.                       - Patient identification and proposed procedure were                        verified prior to the procedure by the nurse. The                        procedure was verified in the procedure room.                       - ASA Grade Assessment: III - A patient with severe                        systemic disease.                       - After reviewing the risks and benefits, the patient                        was deemed in satisfactory condition to undergo the                        procedure.                       After obtaining informed consent, the colonoscope was                        passed under direct vision. Throughout the procedure,                        the patient's blood pressure, pulse, and oxygen                        saturations were monitored continuously. The  Colonoscope was introduced through the anus and                        advanced to the the cecum, identified by appendiceal                        orifice and ileocecal valve. The colonoscopy was                        performed without difficulty. The  patient tolerated the                        procedure well. The quality of the bowel preparation                        was excellent. The ileocecal valve, appendiceal                        orifice, and rectum were photographed. Findings:      The perianal and digital rectal examinations were normal. Pertinent       negatives include normal sphincter tone and no palpable rectal lesions.      A few medium-mouthed diverticula were found in the sigmoid colon.      Non-bleeding internal hemorrhoids were found during retroflexion. The       hemorrhoids were Grade I (internal hemorrhoids that do not prolapse).      The exam was otherwise without abnormality. Impression:           - Diverticulosis in the sigmoid colon.                       - Non-bleeding internal hemorrhoids.                       - The examination was otherwise normal.                       - No specimens collected. Recommendation:       - Patient has a contact number available for                        emergencies. The signs and symptoms of potential                        delayed complications were discussed with the patient.                        Return to normal activities tomorrow. Written discharge                        instructions were provided to the patient.                       - Resume previous diet.                       - Continue present medications.                       - Repeat colonoscopy in 10 years for screening purposes.                       -  Return to GI office PRN.                       - The findings and recommendations were discussed with                        the patient and their family. Procedure Code(s):    --- Professional ---                       W8889, Colorectal cancer screening; colonoscopy on                        individual not meeting criteria for high risk Diagnosis Code(s):    --- Professional ---                       K57.30, Diverticulosis of large intestine without                         perforation or abscess without bleeding                       K64.0, First degree hemorrhoids                       Z12.11, Encounter for screening for malignant neoplasm                        of colon CPT copyright 2018 American Medical Association. All rights reserved. The codes documented in this report are preliminary and upon coder review may  be revised to meet current compliance requirements. Stanton Kidney MD, MD 06/28/2018 10:08:10 AM This report has been signed electronically. Number of Addenda: 0 Note Initiated On: 06/28/2018 9:32 AM Scope Withdrawal Time: 0 hours 6 minutes 7 seconds  Total Procedure Duration: 0 hours 10 minutes 21 seconds       [facility]

## 2018-06-28 NOTE — Interval H&P Note (Signed)
History and Physical Interval Note:  06/28/2018 9:35 AM  Ruben [name]  has presented today for surgery, with the diagnosis of COLON CANCER SCREENING  The various methods of treatment have been discussed with the patient and family. After consideration of risks, benefits and other options for treatment, the patient has consented to  Procedure(s): COLONOSCOPY WITH PROPOFOL ([name]) as a surgical intervention .  The patient's history has been reviewed, patient examined, no change in status, stable for surgery.  I have reviewed the patient's chart and labs.  Questions were answered to the patient's satisfaction.     Jeddo, Kyle

## 2018-06-28 NOTE — Anesthesia Preprocedure Evaluation (Addendum)
Anesthesia Evaluation  Patient identified by MRN, date of birth, ID band Patient awake    Reviewed: Allergy & Precautions, H&P , NPO status , Patient's Chart, lab work & pertinent test results  Airway Mallampati: III  TM Distance: >3 FB    Comment: Beard Dental  (+) Teeth Intact   Pulmonary sleep apnea , former smoker,           Cardiovascular hypertension,      Neuro/Psych negative neurological ROS  negative psych ROS   GI/Hepatic negative GI ROS, Neg liver ROS,   Endo/Other  negative endocrine ROS  Renal/GU negative Renal ROS  negative genitourinary   Musculoskeletal   Abdominal   Peds  Hematology negative hematology ROS (+)   Anesthesia Other Findings Obese  Past Medical History: No date: Arthritis No date: BPH (benign prostatic hyperplasia) No date: Cataract No date: Hypertension 10/2014: Left leg DVT (HCC)     Comment:  after right knee surgery had DVT in left leg No date: Obesity No date: Restless leg 01/2015: Right leg DVT (HCC)     Comment:  while on anticoagulation No date: Sleep apnea No date: Varicose veins  Past Surgical History: No date: ELBOW ARTHROSCOPY; Bilateral 2005, 1983: KNEE ARTHROPLASTY; Left 09/16/2014: KNEE ARTHROSCOPY; Right     Comment:  Procedure: ARTHROSCOPY KNEE-partial menisectomy;                Surgeon: Donato Heinz, MD;  Location: [facility] ORS;                Service: Orthopedics;  Laterality: Right; No date: NECK SURGERY     Comment:  cervical fusion 01/29/2015: PERIPHERAL VASCULAR CATHETERIZATION; [name]     Comment:  Procedure: IVC Filter Insertion;  Surgeon: Annice Needy,               MD;  Location: [facility] INVASIVE CV LAB;  Service:               Cardiovascular;  Laterality: [name];  BMI    Body Mass Index:  36.05 kg/m      Reproductive/Obstetrics negative OB ROS                            Anesthesia Physical Anesthesia Plan  ASA:  III  Anesthesia Plan: General   Post-op Pain Management:    Induction:   PONV Risk Score and Plan: Propofol infusion and TIVA  Airway Management Planned: Natural Airway and Nasal Cannula  Additional Equipment:   Intra-op Plan:   Post-operative Plan:   Informed Consent: I have reviewed the patients History and Physical, chart, labs and discussed the procedure including the risks, benefits and alternatives for the proposed anesthesia with the patient or authorized representative who has indicated his/her understanding and acceptance.     Dental Advisory Given  Plan Discussed with: Anesthesiologist and CRNA  Anesthesia Plan Comments:         Anesthesia Quick Evaluation

## 2018-06-28 NOTE — Transfer of Care (Signed)
Immediate Anesthesia Transfer of Care Note  Patient: Ruben [name]  Procedure(s) Performed: COLONOSCOPY WITH PROPOFOL ([name] )  Patient Location: Endoscopy Unit  Anesthesia Type:General  Level of Consciousness: awake  Airway & Oxygen Therapy: Patient Spontanous Breathing and Patient connected to nasal cannula oxygen  Post-op Assessment: Report given to RN and Post -op Vital signs reviewed and stable  Post vital signs: stable  Last Vitals:  Vitals Value Taken Time  BP 112/68 06/28/2018 10:12 AM  Temp 36.2 C 06/28/2018 10:11 AM  Pulse 61 06/28/2018 10:16 AM  Resp 23 06/28/2018 10:16 AM  SpO2 98 % 06/28/2018 10:16 AM  Vitals shown include unvalidated device data.  Last Pain:  Vitals:   06/28/18 1011  TempSrc: Tympanic  PainSc: 0-No pain         Complications: No apparent anesthesia complications

## 2018-06-29 ENCOUNTER — Encounter: Payer: Self-pay | Admitting: Internal Medicine

## 2018-06-29 NOTE — Anesthesia Postprocedure Evaluation (Signed)
Anesthesia Post Note  Patient: Ruben [name]  Procedure(s) Performed: COLONOSCOPY WITH PROPOFOL ([name] )  Patient location during evaluation: PACU Anesthesia Type: General Level of consciousness: awake and alert Pain management: pain level controlled Vital Signs Assessment: post-procedure vital signs reviewed and stable Respiratory status: spontaneous breathing, nonlabored ventilation, respiratory function stable and patient connected to nasal cannula oxygen Cardiovascular status: blood pressure returned to baseline and stable Postop Assessment: no apparent nausea or vomiting Anesthetic complications: no     Last Vitals:  Vitals:   06/28/18 1011 06/28/18 1012  BP:  112/68  Pulse:  65  Resp:  (!) 21  Temp: (!) 36.2 C   SpO2:  97%    Last Pain:  Vitals:   06/29/18 0732  TempSrc:   PainSc: 0-No pain                 Jovita Gamma

## 2018-12-25 ENCOUNTER — Other Ambulatory Visit: Payer: Self-pay | Admitting: Physical Medicine and Rehabilitation

## 2018-12-25 DIAGNOSIS — M5412 Radiculopathy, cervical region: Secondary | ICD-10-CM

## 2019-01-02 ENCOUNTER — Ambulatory Visit: Payer: BLUE CROSS/BLUE SHIELD

## 2019-01-05 ENCOUNTER — Other Ambulatory Visit: Payer: Self-pay

## 2019-01-05 ENCOUNTER — Ambulatory Visit
Admission: RE | Admit: 2019-01-05 | Discharge: 2019-01-05 | Disposition: A | Discharge status: Home / Self Care | Payer: BC Managed Care – PPO | Source: Ambulatory Visit | Attending: Physical Medicine and Rehabilitation | Admitting: Physical Medicine and Rehabilitation

## 2019-01-05 DIAGNOSIS — M5412 Radiculopathy, cervical region: Secondary | ICD-10-CM | POA: Diagnosis not present

## 2020-02-27 ENCOUNTER — Telehealth (HOSPITAL_COMMUNITY): Payer: Self-pay

## 2020-02-27 ENCOUNTER — Other Ambulatory Visit (HOSPITAL_COMMUNITY): Payer: Self-pay | Admitting: Family

## 2020-02-27 ENCOUNTER — Other Ambulatory Visit (HOSPITAL_COMMUNITY): Payer: Self-pay

## 2020-02-27 DIAGNOSIS — U071 COVID-19: Secondary | ICD-10-CM

## 2020-02-27 NOTE — Telephone Encounter (Signed)
Called to Discuss with patient about Covid symptoms and the use of the monoclonal antibody infusion for those with mild to moderate Covid symptoms and at a high risk of hospitalization.     Pt appears to qualify for this infusion due to co-morbid conditions and/or a member of an at-risk group in accordance with the FDA Emergency Use Authorization.    Patient interested in treatment.    Pt pre-screended and ready for APP. Sx onset 10/20; test (+) 10/25 at [facility] in [place], results in [place]; Risk Factor: HTN, obesity  Sx include: Fatigue, loss of appetite, diarrhea, dizziness, cough and SOB.

## 2020-02-27 NOTE — Progress Notes (Signed)
I connected by phone with Ruben [name] on 02/27/2020 at 6:37 PM to discuss the potential use of a new treatment for mild to moderate COVID-19 viral infection in non-hospitalized patients.  This patient is a 64 y.o. male that meets the FDA criteria for Emergency Use Authorization of COVID monoclonal antibody casirivimab/imdevimab or bamlanivimab/eteseviamb.  Has a (+) direct SARS-CoV-2 viral test result  Has mild or moderate COVID-19   Is NOT hospitalized due to COVID-19  Is within 10 days of symptom onset  Has at least one of the high risk factor(s) for progression to severe COVID-19 and/or hospitalization as defined in EUA.  Specific high risk criteria : Cardiovascular disease or hypertension   Symptoms of fatigue, diarrhea, dizziness, cough, SOB began 02/20/20.   I have spoken and communicated the following to the patient or parent/caregiver regarding COVID monoclonal antibody treatment:  1. FDA has authorized the emergency use for the treatment of mild to moderate COVID-19 in adults and pediatric patients with positive results of direct SARS-CoV-2 viral testing who are 44 years of age and older weighing at least 40 kg, and who are at high risk for progressing to severe COVID-19 and/or hospitalization.  2. The significant known and potential risks and benefits of COVID monoclonal antibody, and the extent to which such potential risks and benefits are unknown.  3. Information on available alternative treatments and the risks and benefits of those alternatives, including clinical trials.  4. Patients treated with COVID monoclonal antibody should continue to self-isolate and use infection control measures (e.g., wear mask, isolate, social distance, avoid sharing personal items, clean and disinfect high touch surfaces, and frequent handwashing) according to CDC guidelines.   5. The patient or parent/caregiver has the option to accept or refuse COVID monoclonal antibody  treatment.  After reviewing this information with the patient, the patient has agreed to receive one of the available covid 19 monoclonal antibodies and will be provided an appropriate fact sheet prior to infusion. Morton Stall, NP 02/27/2020 6:37 PM

## 2020-02-28 ENCOUNTER — Ambulatory Visit (HOSPITAL_COMMUNITY)
Admission: RE | Admit: 2020-02-28 | Discharge: 2020-02-28 | Disposition: A | Discharge status: Home / Self Care | Payer: 59 | Source: Ambulatory Visit | Attending: Pulmonary Disease | Admitting: Pulmonary Disease

## 2020-02-28 DIAGNOSIS — U071 COVID-19: Secondary | ICD-10-CM | POA: Insufficient documentation

## 2020-02-28 DIAGNOSIS — I10 Essential (primary) hypertension: Secondary | ICD-10-CM | POA: Diagnosis not present

## 2020-02-28 MED ORDER — ALBUTEROL SULFATE HFA 108 (90 BASE) MCG/ACT IN AERS: 2.0000 | INHALATION_SPRAY | Freq: Once | RESPIRATORY_TRACT | Status: DC | PRN

## 2020-02-28 MED ORDER — SODIUM CHLORIDE 0.9 % IV SOLN: Freq: Once | INTRAVENOUS | Status: AC

## 2020-02-28 MED ORDER — EPINEPHRINE 0.3 MG/0.3ML IJ SOAJ: 0.3000 mg | Freq: Once | INTRAMUSCULAR | Status: DC | PRN

## 2020-02-28 MED ORDER — METHYLPREDNISOLONE SODIUM SUCC 125 MG IJ SOLR: 125.0000 mg | Freq: Once | INTRAMUSCULAR | Status: DC | PRN

## 2020-02-28 MED ORDER — FAMOTIDINE IN NACL 20-0.9 MG/50ML-% IV SOLN: 20.0000 mg | Freq: Once | INTRAVENOUS | Status: DC | PRN

## 2020-02-28 MED ORDER — SODIUM CHLORIDE 0.9 % IV SOLN: INTRAVENOUS | Status: DC | PRN

## 2020-02-28 MED ORDER — DIPHENHYDRAMINE HCL 50 MG/ML IJ SOLN: 50.0000 mg | Freq: Once | INTRAMUSCULAR | Status: DC | PRN

## 2020-02-28 NOTE — Discharge Instructions (Signed)

## 2020-02-28 NOTE — Progress Notes (Signed)
  Diagnosis: COVID-19  Physician: Dr. Wright  Procedure: Cov[facility] Infusion [facility] Med: bamlanivimab\etesevimab infusion - Provided patient with bamlanimivab\etesevimab fact sheet for patients, parents and caregivers prior to infusion.  Complications: No immediate complications noted.  Discharge: Discharged home    M  02/28/2020  

## 2020-04-18 ENCOUNTER — Other Ambulatory Visit: Payer: Self-pay | Admitting: Physician Assistant

## 2020-04-18 DIAGNOSIS — H9120 Sudden idiopathic hearing loss, unspecified ear: Secondary | ICD-10-CM

## 2020-04-28 ENCOUNTER — Ambulatory Visit: Payer: 59

## 2020-05-01 ENCOUNTER — Ambulatory Visit: Payer: 59

## 2020-05-14 ENCOUNTER — Other Ambulatory Visit: Payer: Self-pay

## 2020-05-14 ENCOUNTER — Ambulatory Visit
Admission: RE | Admit: 2020-05-14 | Discharge: 2020-05-14 | Disposition: A | Discharge status: Home / Self Care | Payer: Medicare Other | Source: Ambulatory Visit | Attending: Physician Assistant | Admitting: Physician Assistant

## 2020-05-14 DIAGNOSIS — H9120 Sudden idiopathic hearing loss, unspecified ear: Secondary | ICD-10-CM | POA: Diagnosis present

## 2020-05-14 MED ORDER — GADOBUTROL 1 MMOL/ML IV SOLN
9.0000 mL | Freq: Once | INTRAVENOUS | Status: AC | PRN
  Administered 2020-05-14: 9 mL via INTRAVENOUS

## 2020-05-27 ENCOUNTER — Other Ambulatory Visit: Payer: 59

## 2020-09-09 ENCOUNTER — Ambulatory Visit: Payer: Medicare Other | Admitting: Podiatry

## 2020-09-11 ENCOUNTER — Other Ambulatory Visit: Payer: Self-pay

## 2020-09-11 ENCOUNTER — Ambulatory Visit (INDEPENDENT_AMBULATORY_CARE_PROVIDER_SITE_OTHER): Payer: Medicare Other

## 2020-09-11 ENCOUNTER — Encounter: Payer: Self-pay | Admitting: Podiatry

## 2020-09-11 ENCOUNTER — Ambulatory Visit (INDEPENDENT_AMBULATORY_CARE_PROVIDER_SITE_OTHER): Payer: Medicare Other | Admitting: Podiatry

## 2020-09-11 DIAGNOSIS — M722 Plantar fascial fibromatosis: Secondary | ICD-10-CM

## 2020-09-11 DIAGNOSIS — M7661 Achilles tendinitis, right leg: Secondary | ICD-10-CM | POA: Diagnosis not present

## 2020-09-11 MED ORDER — TRIAMCINOLONE ACETONIDE 10 MG/ML IJ SUSP
20.0000 mg | Freq: Once | INTRAMUSCULAR | Status: AC
  Administered 2020-09-11: 20 mg

## 2020-09-11 NOTE — Progress Notes (Signed)
Subjective:   Patient ID: Ruben [name], male   DOB: 65 y.o.   MRN: [id number]   HPI Patient states he has been doing pretty well over the last few years but has developed pain again in both feet and points to the back of the right heel and the bottom of the left heel.  States that he does take anti-inflammatories and other pain medications and does not smoke is moderately obese and tries to be active   Review of Systems  All other systems reviewed and are negative.       Objective:  Physical Exam Vitals and nursing note reviewed.  Constitutional:      Appearance: He is well-developed.  Pulmonary:     Effort: Pulmonary effort is normal.  Musculoskeletal:        General: Normal range of motion.  Skin:    General: Skin is warm.  Neurological:     Mental Status: He is alert.     Neurovascular status intact muscle strength adequate range of motion adequate with patient found to have discomfort in the posterior heel region only on the medial side no central lateral involvement on discomfort on the plantar heel left with inflammation fluid of the medial band.  Patient is found to have good digital perfusion well oriented x3 with moderate depression of the arch     Assessment:  Acute fasciitis symptoms left Achilles tendinitis right and patient who wears orthotics which may not be providing proper support     Plan:  H&P all conditions reviewed and today x-rays reviewed.  I did sterile prep injected the fascia left plantar 3 mg Kenalog 5 mg Xylocaine and for the right I carefully injected the Achilles explaining chances for rupture prior and he agrees to do this with 3 mg Dexasone Kenalog 5 mg Xylocaine and went ahead and advised on support therapy and stretching exercises and will bring his orthotics in next visit  X-rays indicate significant plantar spur formation bilateral

## 2020-09-25 ENCOUNTER — Ambulatory Visit: Payer: Medicare Other | Admitting: Podiatry

## 2020-10-02 ENCOUNTER — Other Ambulatory Visit: Payer: Self-pay

## 2020-10-02 ENCOUNTER — Encounter: Payer: Self-pay | Admitting: Podiatry

## 2020-10-02 ENCOUNTER — Ambulatory Visit (INDEPENDENT_AMBULATORY_CARE_PROVIDER_SITE_OTHER): Payer: Medicare Other | Admitting: Podiatry

## 2020-10-02 DIAGNOSIS — M722 Plantar fascial fibromatosis: Secondary | ICD-10-CM

## 2020-10-02 MED ORDER — TRIAMCINOLONE ACETONIDE 10 MG/ML IJ SUSP
10.0000 mg | Freq: Once | INTRAMUSCULAR | Status: AC
  Administered 2020-10-02: 10 mg

## 2020-10-02 NOTE — Progress Notes (Signed)
Subjective:   Patient ID: Ruben [name], male   DOB: 65 y.o.   MRN: [id number]   HPI Patient presents stating overall he is doing pretty good the right heel is bothering him still to a moderate degree   ROS      Objective:  Physical Exam  Neurovascular status intact exquisite discomfort of the right plantar fashion insertional point tendon calcaneus     Assessment:  Acute Planter fasciitis right present     Plan:  Sterile prep injected the fascia 3 mg Kenalog 5 mg Xylocaine advised on anti-inflammatories reappoint to recheck again

## 2021-06-10 ENCOUNTER — Other Ambulatory Visit: Payer: Self-pay | Admitting: Family Medicine

## 2021-06-10 DIAGNOSIS — M5416 Radiculopathy, lumbar region: Secondary | ICD-10-CM

## 2021-06-15 ENCOUNTER — Other Ambulatory Visit: Payer: Self-pay | Admitting: Family Medicine

## 2021-06-15 DIAGNOSIS — M5416 Radiculopathy, lumbar region: Secondary | ICD-10-CM

## 2021-06-22 ENCOUNTER — Other Ambulatory Visit: Payer: Self-pay

## 2021-06-22 ENCOUNTER — Ambulatory Visit
Admission: RE | Admit: 2021-06-22 | Discharge: 2021-06-22 | Disposition: A | Discharge status: Home / Self Care | Payer: Medicare Other | Source: Ambulatory Visit | Attending: Family Medicine | Admitting: Family Medicine

## 2021-06-22 DIAGNOSIS — M5416 Radiculopathy, lumbar region: Secondary | ICD-10-CM

## 2021-06-30 ENCOUNTER — Other Ambulatory Visit: Payer: Self-pay | Admitting: Family Medicine

## 2021-06-30 DIAGNOSIS — M5414 Radiculopathy, thoracic region: Secondary | ICD-10-CM

## 2021-07-03 ENCOUNTER — Other Ambulatory Visit: Payer: Self-pay

## 2021-07-03 ENCOUNTER — Ambulatory Visit
Admission: RE | Admit: 2021-07-03 | Discharge: 2021-07-03 | Disposition: A | Discharge status: Home / Self Care | Payer: Medicare Other | Source: Ambulatory Visit | Attending: Family Medicine | Admitting: Family Medicine

## 2021-07-03 DIAGNOSIS — M5414 Radiculopathy, thoracic region: Secondary | ICD-10-CM

## 2021-11-11 ENCOUNTER — Ambulatory Visit: Payer: Medicare Other

## 2022-09-23 ENCOUNTER — Ambulatory Visit (INDEPENDENT_AMBULATORY_CARE_PROVIDER_SITE_OTHER): Payer: Medicare Other | Admitting: Urology

## 2022-09-23 ENCOUNTER — Encounter: Payer: Self-pay | Admitting: Urology

## 2022-09-23 VITALS — BP 127/84 | HR 76 | Ht 64.0 in | Wt 230.0 lb

## 2022-09-23 DIAGNOSIS — R972 Elevated prostate specific antigen [PSA]: Secondary | ICD-10-CM

## 2022-09-23 NOTE — Patient Instructions (Signed)
Prostate MRI Prep:  1- No ejaculation 48 hours prior to exam  2- No caffeine or carbonated beverages on day of the exam  3- Eat light diet evening prior and day of exam  4- Avoid eating 4 hours prior to exam  5- Fleets enema needs to be done 4 hours prior to exam -See below. Can be purchased at the drug store.      

## 2022-09-23 NOTE — Progress Notes (Signed)
I, Ruben [name],acting as a scribe for Ruben Altes, MD.,have documented all relevant documentation on the behalf of Ruben Altes, MD,as directed by  Ruben Altes, MD while in the presence of Ruben Altes, MD.  09/23/2022 9:32 AM   Ruben [name] November 29, 1955 [id number]  Referring provider: Marina Goodell, MD [address] [place],  [place] [address]  Chief Complaint  Patient presents with   Elevated PSA    HPI: Ruben [name] is a 67 y.o. male here for evaluation of an elevated PSA.  PSA February 2024 slightly elevated at 4.38 Repeat PSA 08/2022 was 5.01 PSA has slowly risen since 2019 at 1.20 and was 2.56 in August 2022. Long history of recurrent prostatitis and has been on Tamsulosin for several years. No history of bacterial UTIs or gross hematuria. No history or family history of prostate cancer.   PMH: Past Medical History:  Diagnosis Date   Arthritis    BPH (benign prostatic hyperplasia)    Cataract    Hypertension    Left leg DVT (HCC) 10/2014   after right knee surgery had DVT in left leg   Obesity    Restless leg    Right leg DVT (HCC) 01/2015   while on anticoagulation   Sleep apnea    Varicose veins     Surgical History: Past Surgical History:  Procedure Laterality Date   COLONOSCOPY WITH PROPOFOL [name] 06/28/2018   Procedure: COLONOSCOPY WITH PROPOFOL;  Surgeon: Toledo, Boykin Nearing, MD;  Location: [facility];  Service: Gastroenterology;  Laterality: [name];   ELBOW ARTHROSCOPY Bilateral    KNEE ARTHROPLASTY Left 2005, 1983   KNEE ARTHROSCOPY Right 09/16/2014   Procedure: ARTHROSCOPY KNEE-partial menisectomy;  Surgeon: Donato Heinz, MD;  Location: [facility];  Service: Orthopedics;  Laterality: Right;   NECK SURGERY     cervical fusion   PERIPHERAL VASCULAR CATHETERIZATION [name] 01/29/2015   Procedure: IVC Filter Insertion;  Surgeon: Annice Needy, MD;  Location: [facility];  Service: Cardiovascular;  Laterality: [name];     Home Medications:  Allergies as of 09/23/2022       Reactions   Aleve [naproxen] Rash   Rectal spasms   Diclofenac Other (See Comments)   Rectal pain   Diclofenac-misoprostol Rash   Ibuprofen Rash   Rectal spasm    Lodine [etodolac] Rash   Turmeric Rash        Medication List        Accurate as of Sep 23, 2022  9:32 AM. If you have any questions, ask your nurse or doctor.          STOP taking these medications    albuterol 108 (90 Base) MCG/ACT inhaler Commonly known as: VENTOLIN HFA Stopped by: Ruben Altes, MD   HYDROcodone-acetaminophen 5-325 MG tablet Commonly known as: NORCO/VICODIN Stopped by: Ruben Altes, MD   lisinopril 10 MG tablet Commonly known as: ZESTRIL Stopped by: Ruben Altes, MD   lisinopril-hydrochlorothiazide 10-12.5 MG tablet Commonly known as: ZESTORETIC Stopped by: Ruben Altes, MD   rivaroxaban 20 MG Tabs tablet Commonly known as: XARELTO Stopped by: Ruben Altes, MD       TAKE these medications    amLODipine 2.5 MG tablet Commonly known as: NORVASC Take by mouth.   aspirin EC 81 MG tablet Take by mouth.   baclofen 10 MG tablet Commonly known as: LIORESAL Take 10 mg by mouth 2 (two) times daily at 10 AM  and 5 PM.   celecoxib 200 MG capsule Commonly known as: CELEBREX Take 1 capsule by mouth 2 (two) times daily.   FLOMAX PO Take 4 mg by mouth daily.   hyoscyamine 0.125 MG Tbdp disintergrating tablet Commonly known as: ANASPAZ PLACE 1 TABLET UNDER THE TONGUE EVERY 6 HOURS AS NEEDED FOR CRAMPING   losartan-hydrochlorothiazide 50-12.5 MG tablet Commonly known as: HYZAAR Take 1 tablet by mouth daily.   Melatonin 3 MG Caps Take by mouth as needed. Reported on 05/22/2015   Melatonin-Tryptophan 3-100 MG Caps melatonin-tryptophan 3 mg-100 mg capsule   omeprazole 20 MG capsule Commonly known as: PRILOSEC Take 20 mg by mouth daily.   pantoprazole 40 MG tablet Commonly known as: PROTONIX Take 1  tablet by mouth daily.   pregabalin 75 MG capsule Commonly known as: LYRICA Take 150 mg by mouth 2 (two) times daily.   rosuvastatin 5 MG tablet Commonly known as: CRESTOR Take 1 tablet by mouth daily.   Saw Palmetto 1000 MG Caps Take 1 capsule by mouth daily.   zolpidem 10 MG tablet Commonly known as: AMBIEN Take 10 mg by mouth at bedtime as needed for sleep.        Allergies:  Allergies  Allergen Reactions   Aleve [Naproxen] Rash    Rectal spasms   Diclofenac Other (See Comments)    Rectal pain   Diclofenac-Misoprostol Rash   Ibuprofen Rash    Rectal spasm    Lodine [Etodolac] Rash   Turmeric Rash    Family History: Family History  Problem Relation Age of Onset   Lung cancer Mother 67       with brain mets   Liver cancer Father 63       hepatocellular cancer   Other Father 62       carcinogeneticsarcoma of the mouth   Myelodysplastic syndrome Paternal Uncle    Deep vein thrombosis Paternal Uncle        dvt after surgery   Heart disease Other    Hypertension Other    Liver disease Other     Social History:  reports that he has quit smoking. His smoking use included cigarettes. He has a 10.00 pack-year smoking history. He has quit using smokeless tobacco. He reports current alcohol use of about 1.0 standard drink of alcohol per week. He reports that he does not use drugs.   Physical Exam: BP 127/84   Pulse 76   Ht 5\' 4"  (1.626 m)   Wt 230 lb (104.3 kg)   BMI 39.48 kg/m   Constitutional:  Alert and oriented, No acute distress. HEENT: [employer] AT Respiratory: Normal respiratory effort, no increased work of breathing. GI: Abdomen is soft, nontender, nondistended, no abdominal masses GU: Prostate 40 grams, smooth without nodules Skin: No rashes, bruises or suspicious lesions. Psychiatric: Normal mood and affect.    Assessment & Plan:    1. Elevated PSA Although PSA is a prostate cancer screening test he was informed that cancer is not the most common  cause of an elevated PSA. Other potential causes including BPH and inflammation were discussed. He was informed that the only way to adequately diagnose prostate cancer would be a transrectal ultrasound and biopsy of the prostate. The procedure was discussed including potential risks of bleeding and infection/sepsis. He was also informed that a negative biopsy does not conclusively rule out the possibility that prostate cancer may be present and that continued monitoring is required. The use of newer adjunctive blood tests including PHI  and 4kScore were discussed. The use of multiparametric prostate MRI to evaluate for abnormalities that are suspicious for high-grade prostate cancer and aid in targeted biopsy was reviewed. Continued periodic surveillance was also discussed.  He has elected further evaluation with prostate MRI- order entered and we'll call with results.  I have reviewed the above documentation for accuracy and completeness, and I agree with the above.   Ruben Altes, MD  [facility] [address] [place], [place] [address] [phone]

## 2022-10-05 ENCOUNTER — Ambulatory Visit
Admission: RE | Admit: 2022-10-05 | Discharge: 2022-10-05 | Disposition: A | Discharge status: Home / Self Care | Payer: Medicare Other | Source: Ambulatory Visit | Attending: Urology | Admitting: Urology

## 2022-10-05 DIAGNOSIS — R972 Elevated prostate specific antigen [PSA]: Secondary | ICD-10-CM | POA: Diagnosis present

## 2022-10-05 MED ORDER — GADOBUTROL 1 MMOL/ML IV SOLN
10.0000 mL | Freq: Once | INTRAVENOUS | Status: AC | PRN
  Administered 2022-10-05: 10 mL via INTRAVENOUS

## 2022-10-18 ENCOUNTER — Ambulatory Visit: Payer: Self-pay

## 2022-10-18 ENCOUNTER — Ambulatory Visit
Admission: EM | Admit: 2022-10-18 | Discharge: 2022-10-18 | Disposition: A | Discharge status: Home / Self Care | Payer: Medicare Other | Attending: Physician Assistant | Admitting: Physician Assistant

## 2022-10-18 DIAGNOSIS — Z1152 Encounter for screening for COVID-19: Secondary | ICD-10-CM | POA: Insufficient documentation

## 2022-10-18 DIAGNOSIS — A084 Viral intestinal infection, unspecified: Secondary | ICD-10-CM

## 2022-10-18 DIAGNOSIS — R197 Diarrhea, unspecified: Secondary | ICD-10-CM | POA: Diagnosis present

## 2022-10-18 DIAGNOSIS — R509 Fever, unspecified: Secondary | ICD-10-CM | POA: Diagnosis present

## 2022-10-18 LAB — SARS CORONAVIRUS 2 BY RT PCR: SARS Coronavirus 2 by RT PCR: NEGATIVE

## 2022-10-18 NOTE — ED Notes (Signed)
Offered Covid testing, declined. States he had a negative rapid home test.

## 2022-10-18 NOTE — Discharge Instructions (Signed)
STOMACH VIRUS: COVID negative. Likely a stomach virus. You may take Tylenol for pain relief. Use medications as directed including antiemetics and antidiarrheal medications if suggested or prescribed. You should increase fluids and electrolytes as well as rest over these next several days. If you have any questions or concerns, or if your symptoms are not improving or if especially if they acutely worsen, please call or stop back to the clinic immediately and we will be happy to help you or go to the ER   ABDOMINAL PAIN RED FLAGS: Seek immediate further care if: symptoms remain the same or worsen over the next 3-7 days, you are unable to keep fluids down, you see blood or mucus in your stool, you vomit black or dark red material, you have a fever of 101.F or higher, you have localized and/or persistent abdominal pain

## 2022-10-18 NOTE — ED Triage Notes (Signed)
Patient presents to UC for diarrhea since Thursday, fever and HA since Friday. Concerned with covid and dehydration. Treating with pepto and immodium.

## 2022-10-18 NOTE — ED Provider Notes (Signed)
[facility]    CSN: [id number] Arrival date & time: 10/18/22  1447      History   Chief Complaint Chief Complaint  Patient presents with   Diarrhea   Fever   Headache    HPI Ruben [name] is a 67 y.o. male presenting for 3-4-day history of multiple episodes of diarrhea per day, fatigue, body aches, fever up to 102 degrees, loss of appetite, nausea.  Patient reports he has not had a fever since Saturday.  Has not been taking any ibuprofen or Tylenol.  Reports the first day of his symptoms he had 50+ episodes of diarrhea.  States he has had 2 episodes of diarrhea today.  Took Imodium once this morning.  Denies black or bloody stool.  No recent travel or antibiotic use.  No sick contacts.  Not reporting any abdominal pain or vomiting.  No cough, congestion or sore throat.  Concern for possible COVID-19.  Denies any COVID exposure.  Had negative home test.  No other complaints.  HPI  Past Medical History:  Diagnosis Date   Arthritis    BPH (benign prostatic hyperplasia)    Cataract    Hypertension    Left leg DVT (HCC) 10/2014   after right knee surgery had DVT in left leg   Obesity    Restless leg    Right leg DVT (HCC) 01/2015   while on anticoagulation   Sleep apnea    Varicose veins     Patient Active Problem List   Diagnosis Date Noted   Primary localized osteoarthrosis, hand 06/30/2016   Right carpal tunnel syndrome 06/30/2016   Chronic left shoulder pain 06/09/2016   Acute deep vein thrombosis (DVT) of distal vein of left lower extremity (HCC) 11/19/2015   Obstructive sleep apnea syndrome 05/28/2015   Morbid obesity with BMI of 40.0-44.9, adult (HCC) 04/09/2015   Deep venous thrombosis (HCC) 10/03/2014   Status post arthroscopy of right knee 09/24/2014   Internal derangement of right knee 09/16/2014   Patellofemoral stress syndrome 04/05/2014   Greater trochanteric pain syndrome 02/13/2014   GERD (gastroesophageal reflux disease) 10/11/2013    Arthritis 08/17/2013   Bladder neck obstruction 08/17/2013   Enthesopathy 08/17/2013   Hypercholesterolemia 08/17/2013   Hypertensive disorder 08/17/2013   Insomnia 08/17/2013    Past Surgical History:  Procedure Laterality Date   COLONOSCOPY WITH PROPOFOL [name] 06/28/2018   Procedure: COLONOSCOPY WITH PROPOFOL;  Surgeon: Toledo, Boykin Nearing, MD;  Location: [facility];  Service: Gastroenterology;  Laterality: [name];   ELBOW ARTHROSCOPY Bilateral    KNEE ARTHROPLASTY Left 2005, 1983   KNEE ARTHROSCOPY Right 09/16/2014   Procedure: ARTHROSCOPY KNEE-partial menisectomy;  Surgeon: Donato Heinz, MD;  Location: [facility];  Service: Orthopedics;  Laterality: Right;   NECK SURGERY     cervical fusion   PERIPHERAL VASCULAR CATHETERIZATION [name] 01/29/2015   Procedure: IVC Filter Insertion;  Surgeon: Annice Needy, MD;  Location: [facility];  Service: Cardiovascular;  Laterality: [name];       Home Medications    Prior to Admission medications   Medication Sig Start Date End Date Taking? Authorizing Provider  amLODipine (NORVASC) 2.5 MG tablet Take by mouth. 06/13/20   [provider]  aspirin 81 MG EC tablet Take by mouth.    [provider]  baclofen (LIORESAL) 10 MG tablet Take 10 mg by mouth 2 (two) times daily at 10 AM and 5 PM.     [provider]  celecoxib (CELEBREX)  200 MG capsule Take 1 capsule by mouth 2 (two) times daily. 03/24/20   [provider]  hyoscyamine (ANASPAZ) 0.125 MG TBDP disintergrating tablet PLACE 1 TABLET UNDER THE TONGUE EVERY 6 HOURS AS NEEDED FOR CRAMPING 11/12/19   [provider]  losartan-hydrochlorothiazide (HYZAAR) 50-12.5 MG tablet Take 1 tablet by mouth daily.    [provider]  Melatonin 3 MG CAPS Take by mouth as needed. Reported on 05/22/2015    [provider]  Melatonin-Tryptophan 3-100 MG CAPS melatonin-tryptophan 3 mg-100 mg capsule    [provider]  omeprazole (PRILOSEC) 20  MG capsule Take 20 mg by mouth daily.    [provider]  pantoprazole (PROTONIX) 40 MG tablet Take 1 tablet by mouth daily. 03/24/20   [provider]  pregabalin (LYRICA) 75 MG capsule Take 150 mg by mouth 2 (two) times daily.     [provider]  rosuvastatin (CRESTOR) 5 MG tablet Take 1 tablet by mouth daily. 09/30/20   [provider]  Saw Palmetto 1000 MG CAPS Take 1 capsule by mouth daily.    [provider]  Tamsulosin HCl (FLOMAX PO) Take 4 mg by mouth daily.     [provider]  zolpidem (AMBIEN) 10 MG tablet Take 10 mg by mouth at bedtime as needed for sleep.    [provider]    Family History Family History  Problem Relation Age of Onset   Lung cancer Mother 74       with brain mets   Liver cancer Father 17       hepatocellular cancer   Other Father 45       carcinogeneticsarcoma of the mouth   Myelodysplastic syndrome Paternal Uncle    Deep vein thrombosis Paternal Uncle        dvt after surgery   Heart disease Other    Hypertension Other    Liver disease Other     Social History Social History   Tobacco Use   Smoking status: Former    Packs/day: 1.00    Years: 10.00    Additional pack years: 0.00    Total pack years: 10.00    Types: Cigarettes   Smokeless tobacco: Former  Building services engineer Use: Never used  Substance Use Topics   Alcohol use: Yes    Alcohol/week: 1.0 standard drink of alcohol    Types: 1 Shots of liquor per week    Comment: occ   Drug use: No     Allergies   Aleve [naproxen], Diclofenac, Diclofenac-misoprostol, Ibuprofen, Lodine [etodolac], and Turmeric   Review of Systems Review of Systems  Constitutional:  Positive for appetite change, fatigue and fever.  HENT:  Negative for congestion, rhinorrhea, sinus pressure, sinus pain and sore throat.   Respiratory:  Negative for cough and shortness of breath.   Gastrointestinal:  Positive for diarrhea and nausea. Negative  for abdominal pain and vomiting.  Musculoskeletal:  Negative for myalgias.  Neurological:  Positive for headaches. Negative for weakness and light-headedness.  Hematological:  Negative for adenopathy.     Physical Exam Triage Vital Signs ED Triage Vitals  Enc Vitals Group     BP 10/18/22 1511 116/76     Pulse Rate 10/18/22 1511 79     Resp 10/18/22 1511 16     Temp --      Temp src --      SpO2 10/18/22 1511 97 %     Weight --  Height --      Head Circumference --      Peak Flow --      Pain Score 10/18/22 1510 0     Pain Loc --      Pain Edu? --      Excl. in GC? --    No data found.  Updated Vital Signs BP 116/76 (BP Location: Left Arm)   Pulse 79   Temp 97.8 F (36.6 C) (Temporal)   Resp 16   SpO2 97%      Physical Exam Vitals and nursing note reviewed.  Constitutional:      General: He is not in acute distress.    Appearance: Normal appearance. He is well-developed. He is not ill-appearing.  HENT:     Head: Normocephalic and atraumatic.     Nose: Nose normal.     Mouth/Throat:     Mouth: Mucous membranes are dry.     Pharynx: Oropharynx is clear.  Eyes:     General: No scleral icterus.    Conjunctiva/sclera: Conjunctivae normal.  Cardiovascular:     Rate and Rhythm: Normal rate and regular rhythm.     Heart sounds: Normal heart sounds.  Pulmonary:     Effort: Pulmonary effort is normal. No respiratory distress.     Breath sounds: Normal breath sounds.  Abdominal:     Palpations: Abdomen is soft.     Tenderness: There is no abdominal tenderness. There is no guarding or rebound.  Musculoskeletal:     Cervical back: Neck supple.  Skin:    General: Skin is warm and dry.     Capillary Refill: Capillary refill takes less than 2 seconds.  Neurological:     General: No focal deficit present.     Mental Status: He is alert. Mental status is at baseline.     Motor: No weakness.     Gait: Gait normal.  Psychiatric:        Mood and Affect: Mood  normal.        Behavior: Behavior normal.      UC Treatments / Results  Labs (all labs ordered are listed, but only abnormal results are displayed) Labs Reviewed  SARS CORONAVIRUS 2 BY RT PCR    EKG   Radiology No results found.  Procedures Procedures (including critical care time)  Medications Ordered in UC Medications - No data to display  Initial Impression / Assessment and Plan / UC Course  I have reviewed the triage vital signs and the nursing notes.  Pertinent labs & imaging results that were available during my care of the patient were reviewed by me and considered in my medical decision making (see chart for details).   67 year old male presents for diarrhea, fatigue, fever.  Symptoms started 3 to 4 days ago.  Fever has resolved.  Diarrhea has slowed down.  Has taken Imodium once today and had 2 episodes of diarrhea today.  No complaints of abdominal pain or vomiting.  No sick contacts, recent antibiotics or recent travel.  No history of any GI issues other than GERD.  Vitals are all normal and stable.  He is overall well-appearing.  Tongue is a little dry.  Abdomen soft and nontender.  Chest clear to auscultation.  COVID test negative.  Reviewed COVID test results with patient.  Suspect symptoms are due to viral gastroenteritis.  He has broken the fever and the diarrhea has slowed down.  Advised I suspect his symptoms will continue to improve.  He  should increase his rest and fluids.  He says he has been drinking Powerade, Gatorade and has been sticking to a bland diet.  Advised him to continue this.  Imodium as needed.  Advised if fever returns or if he develops abdominal pain or increased weakness or fatigue to go to emergency department for reevaluation.   Final Clinical Impressions(s) / UC Diagnoses   Final diagnoses:  Viral gastroenteritis  Diarrhea, unspecified type     Discharge Instructions      STOMACH VIRUS: COVID negative. Likely a stomach virus.  You may take Tylenol for pain relief. Use medications as directed including antiemetics and antidiarrheal medications if suggested or prescribed. You should increase fluids and electrolytes as well as rest over these next several days. If you have any questions or concerns, or if your symptoms are not improving or if especially if they acutely worsen, please call or stop back to the clinic immediately and we will be happy to help you or go to the ER   ABDOMINAL PAIN RED FLAGS: Seek immediate further care if: symptoms remain the same or worsen over the next 3-7 days, you are unable to keep fluids down, you see blood or mucus in your stool, you vomit black or dark red material, you have a fever of 101.F or higher, you have localized and/or persistent abdominal pain       ED Prescriptions   None    PDMP not reviewed this encounter.   Shirlee Latch, PA-C 10/18/22 1610

## 2022-10-25 ENCOUNTER — Ambulatory Visit: Payer: Medicare Other | Admitting: Urology

## 2022-12-01 ENCOUNTER — Ambulatory Visit (INDEPENDENT_AMBULATORY_CARE_PROVIDER_SITE_OTHER): Payer: Medicare Other | Admitting: Urology

## 2022-12-01 ENCOUNTER — Encounter: Payer: Self-pay | Admitting: Urology

## 2022-12-01 VITALS — BP 118/76 | HR 64

## 2022-12-01 DIAGNOSIS — C61 Malignant neoplasm of prostate: Secondary | ICD-10-CM

## 2022-12-01 DIAGNOSIS — Z2989 Encounter for other specified prophylactic measures: Secondary | ICD-10-CM | POA: Diagnosis not present

## 2022-12-01 DIAGNOSIS — R972 Elevated prostate specific antigen [PSA]: Secondary | ICD-10-CM

## 2022-12-01 MED ORDER — GENTAMICIN SULFATE 40 MG/ML IJ SOLN
80.0000 mg | Freq: Once | INTRAMUSCULAR | Status: AC
  Administered 2022-12-01: 80 mg via INTRAMUSCULAR

## 2022-12-01 MED ORDER — LEVOFLOXACIN 500 MG PO TABS
500.0000 mg | ORAL_TABLET | Freq: Once | ORAL | Status: AC
  Administered 2022-12-01: 500 mg via ORAL

## 2022-12-01 NOTE — Patient Instructions (Signed)

## 2022-12-01 NOTE — Progress Notes (Signed)
   12/01/22  Indication: Elevated PSA 5, abnormal prostate MRI  MRI Fusion Prostate Biopsy Procedure   Informed consent was obtained, and we discussed the risks of bleeding and infection/sepsis. A time out was performed to ensure correct patient identity.  Pre-Procedure: - Last PSA Level: 5 - Gentamicin and levaquin given for antibiotic prophylaxis -Prostate measured 30 g on MRI, PSA density 0.17 - No significant hypoechoic or median lobe noted  Procedure: - Prostate block performed using 10 cc 1% lidocaine  - MRI fusion biopsy was performed, and 3 biopsies were taken from the ROI PIRADS 4 lesion located bilateral posterior medial peripheral zone in the mid gland - Standard biopsies taken from sextant areas, 12 under ultrasound guidance. - Total of 15 cores taken  Post-Procedure: - Patient tolerated the procedure well - He was counseled to seek immediate medical attention if experiences significant bleeding, fevers, or severe pain - Return in one week to discuss biopsy results  Assessment/ Plan: Will follow up in 1-2 weeks to discuss pathology with Dr. Mort Sawyers, MD 12/01/2022

## 2022-12-10 ENCOUNTER — Ambulatory Visit (INDEPENDENT_AMBULATORY_CARE_PROVIDER_SITE_OTHER): Payer: Medicare Other | Admitting: Urology

## 2022-12-10 ENCOUNTER — Encounter: Payer: Self-pay | Admitting: Urology

## 2022-12-10 ENCOUNTER — Ambulatory Visit: Payer: Medicare Other | Admitting: Urology

## 2022-12-10 VITALS — BP 124/80 | HR 83 | Ht 64.0 in | Wt 235.0 lb

## 2022-12-10 DIAGNOSIS — C61 Malignant neoplasm of prostate: Secondary | ICD-10-CM

## 2022-12-10 NOTE — Progress Notes (Signed)
I, Ruben [name], acting as a scribe for Ruben Altes, MD., have documented all relevant documentation on the behalf of Ruben Altes, MD, as directed by Ruben Altes, MD while in the presence of Ruben Altes, MD.  12/10/2022 9:58 AM   Ruben [name] 12-07-1955 [id number]  Referring provider: Marina Goodell, MD [address] [place],  [place] [address]  Chief Complaint  Patient presents with   Results   Urology history: 1. Elevated PSA PSA 5.02 August 2022 PSA February 2024 slightly elevated at 4.38 Repeat PSA 08/2022 was 5.01 PSA has slowly risen since 2019 at 1.20 and was 2.56 in August 2022.  HPI: [name] is a 67 y.o. male presents for prostate biopsy follow-up.  PSA 5.02 August 2022 and prostate MRI with a PI-RADS 4 lesion bilateral posteromedial PZ in the mid-gland; prostate volume 30 cc.  MR fusion biopsy performed 12/01/22. 3 ROI biopsies were performed in addition to a standard 12-core biopsy.  No post-biopsy complaints.  Pathology: ROI biopsies all showed benign prostate tissue. The left base showed Gleason 4+3 adenocarcinoma involving 45% of the submitted tissue; all remaining biopsies showed benign prostate tissue.  Prostate MRI showed no evidence of pelvic adenopathy or extracapsular extension or seminal vesicle involvement.    PMH: Past Medical History:  Diagnosis Date   Arthritis    BPH (benign prostatic hyperplasia)    Cataract    Hypertension    Left leg DVT (HCC) 10/2014   after right knee surgery had DVT in left leg   Obesity    Restless leg    Right leg DVT (HCC) 01/2015   while on anticoagulation   Sleep apnea    Varicose veins     Surgical History: Past Surgical History:  Procedure Laterality Date   COLONOSCOPY WITH PROPOFOL [name] 06/28/2018   Procedure: COLONOSCOPY WITH PROPOFOL;  Surgeon: Toledo, Boykin Nearing, MD;  Location: [facility];  Service: Gastroenterology;  Laterality: [name];   ELBOW ARTHROSCOPY Bilateral     KNEE ARTHROPLASTY Left 2005, 1983   KNEE ARTHROSCOPY Right 09/16/2014   Procedure: ARTHROSCOPY KNEE-partial menisectomy;  Surgeon: Donato Heinz, MD;  Location: [facility];  Service: Orthopedics;  Laterality: Right;   NECK SURGERY     cervical fusion   PERIPHERAL VASCULAR CATHETERIZATION [name] 01/29/2015   Procedure: IVC Filter Insertion;  Surgeon: Annice Needy, MD;  Location: [facility];  Service: Cardiovascular;  Laterality: [name];    Home Medications:  Allergies as of 12/10/2022       Reactions   Aleve [naproxen] Rash   Rectal spasms   Diclofenac Other (See Comments)   Rectal pain   Diclofenac-misoprostol Rash   Ibuprofen Rash   Rectal spasm    Lodine [etodolac] Rash   Turmeric Rash        Medication List        Accurate as of December 10, 2022  9:58 AM. If you have any questions, ask your nurse or doctor.          amLODipine 2.5 MG tablet Commonly known as: NORVASC Take by mouth.   aspirin EC 81 MG tablet Take by mouth.   baclofen 10 MG tablet Commonly known as: LIORESAL Take 10 mg by mouth 2 (two) times daily at 10 AM and 5 PM.   celecoxib 200 MG capsule Commonly known as: CELEBREX Take 1 capsule by mouth 2 (two) times daily.   hyoscyamine 0.125 MG Tbdp disintergrating tablet Commonly  known as: ANASPAZ Place 0.125 mg under the tongue every 4 (four) hours as needed (rectal spasm).   losartan-hydrochlorothiazide 100-25 MG tablet Commonly known as: HYZAAR Take 1 tablet by mouth daily.   Melatonin 3 MG Caps Take by mouth as needed. Reported on 05/22/2015   Melatonin-Tryptophan 3-100 MG Caps melatonin-tryptophan 3 mg-100 mg capsule   omeprazole 20 MG capsule Commonly known as: PRILOSEC Take 20 mg by mouth daily.   pantoprazole 40 MG tablet Commonly known as: PROTONIX Take 1 tablet by mouth daily.   pregabalin 50 MG capsule Commonly known as: LYRICA Take 50 mg by mouth 2 (two) times daily.   rosuvastatin 5 MG tablet Commonly known as:  CRESTOR Take 1 tablet by mouth daily.   Saw Palmetto 1000 MG Caps Take 1 capsule by mouth daily.   tamsulosin 0.4 MG Caps capsule Commonly known as: FLOMAX Take 0.4 mg by mouth daily.   zolpidem 10 MG tablet Commonly known as: AMBIEN Take 10 mg by mouth at bedtime as needed for sleep.        Allergies:  Allergies  Allergen Reactions   Aleve [Naproxen] Rash    Rectal spasms   Diclofenac Other (See Comments)    Rectal pain   Diclofenac-Misoprostol Rash   Ibuprofen Rash    Rectal spasm    Lodine [Etodolac] Rash   Turmeric Rash    Family History: Family History  Problem Relation Age of Onset   Lung cancer Mother 75       with brain mets   Liver cancer Father 46       hepatocellular cancer   Other Father 42       carcinogeneticsarcoma of the mouth   Myelodysplastic syndrome Paternal Uncle    Deep vein thrombosis Paternal Uncle        dvt after surgery   Heart disease Other    Hypertension Other    Liver disease Other     Social History:  reports that he has quit smoking. His smoking use included cigarettes. He has a 10 pack-year smoking history. He has quit using smokeless tobacco. He reports current alcohol use of about 1.0 standard drink of alcohol per week. He reports that he does not use drugs.   Physical Exam: BP 124/80   Pulse 83   Ht 5\' 4"  (1.626 m)   Wt 235 lb (106.6 kg)   BMI 40.34 kg/m   Constitutional:  Alert and oriented, No acute distress. HEENT: [place] AT, moist mucus membranes.  Trachea midline, no masses. Cardiovascular: No clubbing, cyanosis, or edema. Respiratory: Normal respiratory effort, no increased work of breathing. GI: Abdomen is soft, nontender, nondistended, no abdominal masses Skin: No rashes, bruises or suspicious lesions. Neurologic: Grossly intact, no focal deficits, moving all 4 extremities. Psychiatric: Normal mood and affect.  Pertinent imaging: MRI was personally reviewed and interpreted.  MRI EXAM: MR PROSTATE WITHOUT  AND WITH CONTRAST   TECHNIQUE: Multiplanar multisequence MRI images were obtained of the pelvis centered about the prostate. Pre and post contrast images were obtained.   CONTRAST:  10mL GADAVIST GADOBUTROL 1 MMOL/ML IV SOLN   COMPARISON:  CT pelvis 01/31/2008   FINDINGS: Prostate:   Region of interest # 1: PI-RADS category 4 lesion of the bilateral posteromedial peripheral zone in the mid gland with focally reduced T2 signal (image 38, series 11) corresponding to focally reduced ADC map activity and restricted diffusion (image 13 of series 9 and 10) and focal early enhancement (image 155, series 13). This  measures 0.44 cc (1.4 by 0.5 by 0.8 cm).   Volume: 3D volumetric analysis: Prostate volume 30.28 cc (4.9 by 3.5 by 3.7 cm).   Transcapsular spread:  Absent   Seminal vesicle involvement: Absent   Neurovascular bundle involvement: Absent   Pelvic adenopathy: Absent   Bone metastasis: Absent   Other findings: Mild sigmoid colon diverticulosis.   IMPRESSION: 1. PI-RADS category 4 lesion of the bilateral posteromedial peripheral zone in the mid gland. Targeting data sent to [facility]. 2. Mild sigmoid colon diverticulosis.     Electronically Signed   By: Gaylyn Rong M.D.   On: 10/07/2022 10:11  Assessment & Plan:    1. 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  Treatment recommended for intermediate unfavorable risk disease and we discussed the 2 standard treatment options of RALP and radiation modalities including IMRT and brachytherapy.  The pros and cons of each procedure were discussed. He is not interested in pursuing surgical treatment and is interested in radiation, particularly brachytherapy.  Radiation oncology referral placed.  Bone scan ordered.  [facility] [address] [place], [place] [address] [phone]

## 2022-12-11 ENCOUNTER — Encounter: Payer: Self-pay | Admitting: Urology

## 2022-12-20 ENCOUNTER — Encounter
Admission: RE | Admit: 2022-12-20 | Discharge: 2022-12-20 | Disposition: A | Discharge status: Home / Self Care | Payer: Medicare Other | Source: Ambulatory Visit | Attending: Urology | Admitting: Urology

## 2022-12-20 DIAGNOSIS — C61 Malignant neoplasm of prostate: Secondary | ICD-10-CM | POA: Diagnosis present

## 2022-12-20 MED ORDER — TECHNETIUM TC 99M MEDRONATE IV KIT
20.0000 | PACK | Freq: Once | INTRAVENOUS | Status: AC | PRN
  Administered 2022-12-20: 21.68 via INTRAVENOUS

## 2022-12-27 ENCOUNTER — Encounter: Payer: Self-pay | Admitting: Radiation Oncology

## 2022-12-27 ENCOUNTER — Ambulatory Visit
Admission: RE | Admit: 2022-12-27 | Discharge: 2022-12-27 | Disposition: A | Discharge status: Home / Self Care | Payer: Medicare Other | Source: Ambulatory Visit | Attending: Radiation Oncology | Admitting: Radiation Oncology

## 2022-12-27 VITALS — BP 135/85 | HR 68 | Resp 18 | Ht 64.0 in | Wt 243.0 lb

## 2022-12-27 DIAGNOSIS — C61 Malignant neoplasm of prostate: Secondary | ICD-10-CM

## 2022-12-27 NOTE — Consult Note (Signed)
NEW PATIENT EVALUATION  Name: Ruben [name]  MRN: [id number]  Date:   12/27/2022     DOB: Sep 11, 1955   This 67 y.o. male patient presents to the clinic for initial evaluation of stage IIc (cT1 cN0 M0) Gleason 7 (4+3) adenocarcinoma the prostate presenting with a PSA in the 5 range.  REFERRING PHYSICIAN: Marina Goodell, MD  CHIEF COMPLAINT:  Chief Complaint  Patient presents with   Prostate Cancer    consult    DIAGNOSIS: Prostate cancer   PREVIOUS INVESTIGATIONS:  MRI scan reviewed Pathology reports reviewed Clinical notes reviewed  HPI: Patient is a 67 year old male who presented with a slight elevated PSA of 4.38 back in February 2024.  It was 2.5 back in August 2022.  He underwent an MRI scan showing a PI-RADS category 4 lesion bilateral posterior medial peripheral zone of the mid gland.  No evidence of trans capsular spread seminal vesicle involvement or pelvic adenopathy or bone metastasis was noted.  Bone scan was performed but has not been formally read my review there is no evidence of bone metastasis.  Does have some evidence for knee and shoulder arthritis.  Patient underwent biopsy showing 1 core out of 12 positive for Gleason 7 (4+3) adenocarcinoma.  He has been consulted for for surgical options and is now referred to radiation oncology for opinion.  He is leaning towards I-125 interstitial implant.  He has very low side effect profile no significant dysuria nocturia.  No real bone pain does have some shoulder and knee pain.  PLANNED TREATMENT REGIMEN: I-125 interstitial implant  PAST MEDICAL HISTORY:  has a past medical history of Arthritis, BPH (benign prostatic hyperplasia), Cataract, Hypertension, Left leg DVT (HCC) (10/2014), Obesity, Restless leg, Right leg DVT (HCC) (01/2015), Sleep apnea, and Varicose veins.    PAST SURGICAL HISTORY:  Past Surgical History:  Procedure Laterality Date   COLONOSCOPY WITH PROPOFOL [name] 06/28/2018   Procedure: COLONOSCOPY  WITH PROPOFOL;  Surgeon: Toledo, Boykin Nearing, MD;  Location: [facility];  Service: Gastroenterology;  Laterality: [name];   ELBOW ARTHROSCOPY Bilateral    KNEE ARTHROPLASTY Left 2005, 1983   KNEE ARTHROSCOPY Right 09/16/2014   Procedure: ARTHROSCOPY KNEE-partial menisectomy;  Surgeon: Donato Heinz, MD;  Location: [facility] ORS;  Service: Orthopedics;  Laterality: Right;   NECK SURGERY     cervical fusion   PERIPHERAL VASCULAR CATHETERIZATION [name] 01/29/2015   Procedure: IVC Filter Insertion;  Surgeon: Annice Needy, MD;  Location: [facility];  Service: Cardiovascular;  Laterality: [name];    FAMILY HISTORY: family history includes Deep vein thrombosis in his paternal uncle; Heart disease in an other family member; Hypertension in an other family member; Liver cancer (age of onset: 45) in his father; Liver disease in an other family member; Lung cancer (age of onset: 7) in his mother; Myelodysplastic syndrome in his paternal uncle; Other (age of onset: 72) in his father.  SOCIAL HISTORY:  reports that he has quit smoking. His smoking use included cigarettes. He has a 10 pack-year smoking history. He has quit using smokeless tobacco. He reports current alcohol use of about 1.0 standard drink of alcohol per week. He reports that he does not use drugs.  ALLERGIES: Aleve [naproxen], Diclofenac, Diclofenac-misoprostol, Ibuprofen, Lodine [etodolac], and Turmeric  MEDICATIONS:  Current Outpatient Medications  Medication Sig Dispense Refill   hyoscyamine (LEVSIN SL) 0.125 MG SL tablet Place under the tongue.     amLODipine (NORVASC) 2.5 MG tablet Take by mouth.  aspirin 81 MG EC tablet Take by mouth.     baclofen (LIORESAL) 10 MG tablet Take 10 mg by mouth 2 (two) times daily at 10 AM and 5 PM.      celecoxib (CELEBREX) 200 MG capsule Take 1 capsule by mouth 2 (two) times daily.     hyoscyamine (ANASPAZ) 0.125 MG TBDP disintergrating tablet Place 0.125 mg under the tongue every 4 (four) hours as  needed (rectal spasm).     losartan-hydrochlorothiazide (HYZAAR) 100-25 MG tablet Take 1 tablet by mouth daily.     Melatonin 3 MG CAPS Take by mouth as needed. Reported on 05/22/2015     Melatonin-Tryptophan 3-100 MG CAPS melatonin-tryptophan 3 mg-100 mg capsule     omeprazole (PRILOSEC) 20 MG capsule Take 20 mg by mouth daily.     pantoprazole (PROTONIX) 40 MG tablet Take 1 tablet by mouth daily.     pregabalin (LYRICA) 50 MG capsule Take 50 mg by mouth 2 (two) times daily.     rosuvastatin (CRESTOR) 5 MG tablet Take 1 tablet by mouth daily.     Saw Palmetto 1000 MG CAPS Take 1 capsule by mouth daily.     tamsulosin (FLOMAX) 0.4 MG CAPS capsule Take 0.4 mg by mouth daily.     zolpidem (AMBIEN) 10 MG tablet Take 10 mg by mouth at bedtime as needed for sleep.     No current facility-administered medications for this encounter.    ECOG PERFORMANCE STATUS:  0 - Asymptomatic  REVIEW OF SYSTEMS: Patient denies any weight loss, fatigue, weakness, fever, chills or night sweats. Patient denies any loss of vision, blurred vision. Patient denies any ringing  of the ears or hearing loss. No irregular heartbeat. Patient denies heart murmur or history of fainting. Patient denies any chest pain or pain radiating to her upper extremities. Patient denies any shortness of breath, difficulty breathing at night, cough or hemoptysis. Patient denies any swelling in the lower legs. Patient denies any nausea vomiting, vomiting of blood, or coffee ground material in the vomitus. Patient denies any stomach pain. Patient states has had normal bowel movements no significant constipation or diarrhea. Patient denies any dysuria, hematuria or significant nocturia. Patient denies any problems walking, swelling in the joints or loss of balance. Patient denies any skin changes, loss of hair or loss of weight. Patient denies any excessive worrying or anxiety or significant depression. Patient denies any problems with insomnia.  Patient denies excessive thirst, polyuria, polydipsia. Patient denies any swollen glands, patient denies easy bruising or easy bleeding. Patient denies any recent infections, allergies or URI. Patient "s visual fields have not changed significantly in recent time.   PHYSICAL EXAM: BP 135/85   Pulse 68   Resp 18   Ht 5\' 4"  (1.626 m)   Wt 243 lb (110.2 kg)   BMI 41.71 kg/m  Slightly obese male in NAD.  Well-developed well-nourished patient in NAD. HEENT reveals PERLA, EOMI, discs not visualized.  Oral cavity is clear. No oral mucosal lesions are identified. Neck is clear without evidence of cervical or supraclavicular adenopathy. Lungs are clear to A&P. Cardiac examination is essentially unremarkable with regular rate and rhythm without murmur rub or thrill. Abdomen is benign with no organomegaly or masses noted. Motor sensory and DTR levels are equal and symmetric in the upper and lower extremities. Cranial nerves II through XII are grossly intact. Proprioception is intact. No peripheral adenopathy or edema is identified. No motor or sensory levels are noted. Crude visual fields are  within normal range.  LABORATORY DATA: Pathology reports reviewed    RADIOLOGY RESULTS: MRI scan and bone scan reviewed compatible with above-stated findings   IMPRESSION: Stage IIc Gleason 7 (4+3) adenocarcinoma the prostate in 67 year old male  PLAN: This time I believe patient will excellent candidate for I-125 interstitial implant.  Risks and benefits of treatment including increased lower urinary tract symptoms possible diarrhea fatigue risks of general anesthesia and radiation safety precautions all were reviewed with the patient and his wife.  I have asked Dr. Lonna Cobb to start him on 46-month Eligard.  Will also schedule volume study as well as the implant.  Patient comprehends my recommendations well.  I would like to take this opportunity to thank you for allowing me to participate in the care of your  patient.Carmina Miller, MD

## 2022-12-28 ENCOUNTER — Telehealth: Payer: Self-pay

## 2022-12-28 NOTE — Telephone Encounter (Signed)
[facility]  Per CC needs a an appt for Eligard inj.

## 2023-01-04 ENCOUNTER — Telehealth: Payer: Self-pay

## 2023-01-04 NOTE — Telephone Encounter (Signed)
-----   Message from Nurse Cala Bradford D sent at 12/27/2022 10:59 AM EDT ----- Regarding: Eligard Happy Monday Ladies,  This patient is going to need Eligard injections.  Ricki Rodriguez

## 2023-01-04 NOTE — Telephone Encounter (Signed)
No PA required per [facility] e provider portal.

## 2023-01-06 ENCOUNTER — Other Ambulatory Visit: Payer: Self-pay

## 2023-01-06 DIAGNOSIS — C61 Malignant neoplasm of prostate: Secondary | ICD-10-CM

## 2023-01-06 DIAGNOSIS — R8279 Other abnormal findings on microbiological examination of urine: Secondary | ICD-10-CM

## 2023-01-06 NOTE — Progress Notes (Signed)
Brachytherapy Standing Order Form:  Part 1 Volume Study:  Date: 01/19/2023  CPT: 06301, 60109 Procedure: Prostate Volume Study  Part 2 Seed Implant:   Surgeon: Irineo Axon, MD  Date: 02/08/2023    CPT code: 32355, 73220, 25427 Procedure: Brachytherapy Radioactive seed implant  Anesthesia: General  VTE: SCD's  Standing Orders:  -Ancef 2G IV,  -Fleets 2 hr prior, -NPO after midnight,  -UA & Culture

## 2023-01-17 ENCOUNTER — Ambulatory Visit (INDEPENDENT_AMBULATORY_CARE_PROVIDER_SITE_OTHER): Payer: Medicare Other | Admitting: Urology

## 2023-01-17 ENCOUNTER — Encounter: Payer: Self-pay | Admitting: Urology

## 2023-01-17 VITALS — BP 138/76 | HR 80 | Ht 65.0 in | Wt 243.0 lb

## 2023-01-17 DIAGNOSIS — C61 Malignant neoplasm of prostate: Secondary | ICD-10-CM | POA: Diagnosis not present

## 2023-01-17 MED ORDER — LEUPROLIDE ACETATE (6 MONTH) 45 MG ~~LOC~~ KIT: 45.0000 mg | PACK | Freq: Once | SUBCUTANEOUS | Status: DC

## 2023-01-17 NOTE — Progress Notes (Unsigned)
Eligard SubQ Injection   Due to Prostate Cancer patient is present today for a Eligard Injection.  Medication: Eligard 6 month Dose: 45 mg  Location: right  Lot: [id number] Exp: 07/2024  Patient tolerated well, no complications were noted  Performed by: [facility]  Per Dr. Dr. Lonna Cobb patient is to continue therapy for {Time; 3 months to 1 year:10399} . Patient's next follow up was scheduled for 07/25/2022. This appointment was scheduled using wheel and given to patient today along with reminder continue on Vitamin D 800-1000iu and Calcium 1000-1200mg  daily while on Androgen Deprivation Therapy.  PA approval dates:

## 2023-01-17 NOTE — Progress Notes (Signed)
I, Ruben [name], acting as a scribe for Ruben Altes, MD., have documented all relevant documentation on the behalf of Ruben Altes, MD, as directed by Ruben Altes, MD while in the presence of Ruben Altes, MD.  01/17/2023 3:22 PM   Ruben [name] [birth year] [id number]  Referring provider: Marina Goodell, MD [address] [place],  [place] [address]  Chief Complaint  Patient presents with   Elevated PSA   Urology history: 1. Elevated PSA PSA 5.02 August 2022 PSA February 2024 slightly elevated at 4.38 Repeat PSA 08/2022 was 5.01 PSA has slowly risen since 2019 at 1.20 and was 2.56 in August 2022.  HPI: [name] is a 67 y.o. male presents for prostate cancer follow-up and ADT administration.   Recently diagnosed with Gleason 4+3 adenocarcinoma of the prostate.  Scheduled for brachytherapy 02/08/2023 and Dr. Rushie Chestnut had to recommend ADT x6 months.  No complaints today.    PMH: Past Medical History:  Diagnosis Date   Arthritis    BPH (benign prostatic hyperplasia)    Cataract    Hypertension    Left leg DVT (HCC) 10/2014   after right knee surgery had DVT in left leg   Obesity    Restless leg    Right leg DVT (HCC) 01/2015   while on anticoagulation   Sleep apnea    Varicose veins     Surgical History: Past Surgical History:  Procedure Laterality Date   COLONOSCOPY WITH PROPOFOL [name] 06/28/2018   Procedure: COLONOSCOPY WITH PROPOFOL;  Surgeon: Toledo, Boykin Nearing, MD;  Location: [facility];  Service: Gastroenterology;  Laterality: [name];   ELBOW ARTHROSCOPY Bilateral    KNEE ARTHROPLASTY Left 2005, 1983   KNEE ARTHROSCOPY Right 09/16/2014   Procedure: ARTHROSCOPY KNEE-partial menisectomy;  Surgeon: Donato Heinz, MD;  Location: [facility] OR[facility];  Service: Orthopedics;  Laterality: Right;   NECK SURGERY     cervical fusion   PERIPHERAL VASCULAR CATHETERIZATION [name] 01/29/2015   Procedure: IVC Filter Insertion;  Surgeon: Annice Needy, MD;   Location: [facility];  Service: Cardiovascular;  Laterality: [name];    Home Medications:  Allergies as of 01/17/2023       Reactions   Aleve [naproxen] Rash   Rectal spasms   Diclofenac Other (See Comments)   Rectal pain   Diclofenac-misoprostol Rash   Ibuprofen Rash   Rectal spasm    Lodine [etodolac] Rash   Turmeric Rash        Medication List        Accurate as of January 17, 2023  3:22 PM. If you have any questions, ask your nurse or doctor.          STOP taking these medications    Saw Palmetto 1000 MG Caps Stopped by: Ruben [name]       TAKE these medications    amLODipine 2.5 MG tablet Commonly known as: NORVASC Take by mouth.   aspirin EC 81 MG tablet Take by mouth.   baclofen 10 MG tablet Commonly known as: LIORESAL Take 10 mg by mouth 2 (two) times daily at 10 AM and 5 PM.   celecoxib 200 MG capsule Commonly known as: CELEBREX Take 1 capsule by mouth 2 (two) times daily.   hyoscyamine 0.125 MG Tbdp disintergrating tablet Commonly known as: ANASPAZ Place 0.125 mg under the tongue every 4 (four) hours as needed (rectal spasm).   hyoscyamine 0.125 MG SL tablet Commonly known as: LEVSIN  SL Place under the tongue.   losartan-hydrochlorothiazide 100-25 MG tablet Commonly known as: HYZAAR Take 1 tablet by mouth daily.   Melatonin 3 MG Caps Take by mouth as needed. Reported on 05/22/2015   Melatonin-Tryptophan 3-100 MG Caps melatonin-tryptophan 3 mg-100 mg capsule   omeprazole 20 MG capsule Commonly known as: PRILOSEC Take 20 mg by mouth daily.   pantoprazole 40 MG tablet Commonly known as: PROTONIX Take 1 tablet by mouth daily.   pregabalin 50 MG capsule Commonly known as: LYRICA Take 50 mg by mouth 2 (two) times daily.   rosuvastatin 5 MG tablet Commonly known as: CRESTOR Take 1 tablet by mouth daily.   tamsulosin 0.4 MG Caps capsule Commonly known as: FLOMAX Take 0.4 mg by mouth daily.   zolpidem 10 MG  tablet Commonly known as: AMBIEN Take 10 mg by mouth at bedtime as needed for sleep.        Allergies:  Allergies  Allergen Reactions   Aleve [Naproxen] Rash    Rectal spasms   Diclofenac Other (See Comments)    Rectal pain   Diclofenac-Misoprostol Rash   Ibuprofen Rash    Rectal spasm    Lodine [Etodolac] Rash   Turmeric Rash    Family History: Family History  Problem Relation Age of Onset   Lung cancer Mother 59       with brain mets   Liver cancer Father 66       hepatocellular cancer   Other Father 46       carcinogeneticsarcoma of the mouth   Myelodysplastic syndrome Paternal Uncle    Deep vein thrombosis Paternal Uncle        dvt after surgery   Heart disease Other    Hypertension Other    Liver disease Other     Social History:  reports that he has quit smoking. His smoking use included cigarettes. He has a 10 pack-year smoking history. He has quit using smokeless tobacco. He reports current alcohol use of about 1.0 standard drink of alcohol per week. He reports that he does not use drugs.   Physical Exam: BP 138/76   Pulse 80   Ht 5\' 5"  (1.651 m)   Wt 243 lb (110.2 kg)   BMI 40.44 kg/m   Constitutional:  Alert and oriented, No acute distress. HEENT: [place] AT, moist mucus membranes.  Trachea midline, no masses. Cardiovascular: No clubbing, cyanosis, or edema. Respiratory: Normal respiratory effort, no increased work of breathing. GI: Abdomen is soft, nontender, nondistended, no abdominal masses Skin: No rashes, bruises or suspicious lesions. Neurologic: Grossly intact, no focal deficits, moving all 4 extremities. Psychiatric: Normal mood and affect.   Assessment & Plan:    1. Gleason 4+3 adenocarcinoma of the prostate Scheduled for brachytherapy 02/08/23.  We discussed the rationale for ADT in combination with radiation therapy.  The most common side effects were discussed including hot flashes, tiredness, fatigue, low libido, and erectile  dysfunction. He received a 6 month Leuprolide injection.  [facility] [address] [place], [place] [address] [phone]

## 2023-01-19 ENCOUNTER — Ambulatory Visit
Admission: RE | Admit: 2023-01-19 | Discharge: 2023-01-19 | Disposition: A | Discharge status: Home / Self Care | Payer: Medicare Other | Source: Ambulatory Visit | Attending: Radiation Oncology | Admitting: Radiation Oncology

## 2023-01-19 ENCOUNTER — Encounter: Payer: Self-pay | Admitting: Urology

## 2023-01-19 ENCOUNTER — Ambulatory Visit: Admission: RE | Admit: 2023-01-19 | Payer: Medicare Other | Source: Home / Self Care

## 2023-01-19 ENCOUNTER — Encounter: Payer: Self-pay | Admitting: Radiation Oncology

## 2023-01-19 VITALS — BP 125/76 | HR 69 | Temp 97.6°F | Resp 20 | Ht 64.0 in | Wt 248.0 lb

## 2023-01-19 DIAGNOSIS — C61 Malignant neoplasm of prostate: Secondary | ICD-10-CM

## 2023-01-19 SURGERY — ULTRASOUND, PROSTATE, FOR VOLUME DETERMINATION
Anesthesia: Choice

## 2023-01-19 NOTE — H&P (Signed)
NEW PATIENT EVALUATION  Name: Ruben [name]  MRN: [id number]  Date:   01/19/2023     DOB: [birth year]   This 67 y.o. 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  REFERRING PHYSICIAN: Marina Goodell, MD  CHIEF COMPLAINT:  Chief Complaint  Patient presents with   Prostate Cancer    DIAGNOSIS: The encounter diagnosis was Prostate cancer [facility]).   PREVIOUS INVESTIGATIONS:  Volume study performed Pathology reports reviewed Clinical notes reviewed  HPI: Patient is a 67 year old male who presents with a slightly elevated PSA of 4.38 back in February 2024.  MRI scan showed PI-RADS category 4 lesion bilaterally with no evidence of trans capsular spread or seminal vesicle involvement.  1 out of 12 cores was positive for Gleason 7 (4+3) adenocarcinoma.  He opted for I-125 interstitial implant and is taken to the OR today for that.  He is otherwise doing well with minimal complaints.  PLANNED TREATMENT REGIMEN: I-125 interstitial implant  PAST MEDICAL HISTORY:  has a past medical history of Arthritis, BPH (benign prostatic hyperplasia), Cataract, Hypertension, Left leg DVT (HCC) (10/2014), Obesity, Restless leg, Right leg DVT (HCC) (01/2015), Sleep apnea, and Varicose veins.    PAST SURGICAL HISTORY:  Past Surgical History:  Procedure Laterality Date   COLONOSCOPY WITH PROPOFOL [name] 06/28/2018   Procedure: COLONOSCOPY WITH PROPOFOL;  Surgeon: Toledo, Boykin Nearing, MD;  Location: [facility] ENDOSCOPY;  Service: Gastroenterology;  Laterality: [name];   ELBOW ARTHROSCOPY Bilateral    KNEE ARTHROPLASTY Left 2005, 1983   KNEE ARTHROSCOPY Right 09/16/2014   Procedure: ARTHROSCOPY KNEE-partial menisectomy;  Surgeon: Donato Heinz, MD;  Location: [facility] ORS;  Service: Orthopedics;  Laterality: Right;   NECK SURGERY     cervical fusion   PERIPHERAL  VASCULAR CATHETERIZATION [name] 01/29/2015   Procedure: IVC Filter Insertion;  Surgeon: Annice Needy, MD;  Location: [facility] INVASIVE CV LAB;  Service: Cardiovascular;  Laterality: [name];    FAMILY HISTORY: family history includes Deep vein thrombosis in his paternal uncle; Heart disease in an other family member; Hypertension in an other family member; Liver cancer (age of onset: 88) in his father; Liver disease in an other family member; Lung cancer (age of onset: 57) in his mother; Myelodysplastic syndrome in his paternal uncle; Other (age of onset: 15) in his father.  SOCIAL HISTORY:  reports that he has quit smoking. His smoking use included cigarettes. He has a 10 pack-year smoking history. He has quit using smokeless tobacco. He reports current alcohol use of about 1.0 standard drink of alcohol per week. He reports that he does not use drugs.  ALLERGIES: Aleve [naproxen], Diclofenac, Diclofenac-misoprostol, Ibuprofen, Lodine [etodolac], and Turmeric  MEDICATIONS:  Current Outpatient Medications  Medication Sig Dispense Refill   amLODipine (NORVASC) 2.5 MG tablet Take by mouth.     aspirin 81 MG EC tablet Take by mouth.     baclofen (LIORESAL) 10 MG tablet Take 10 mg by mouth 2 (two) times daily at 10 AM and 5 PM.      celecoxib (CELEBREX) 200 MG capsule Take 1 capsule by mouth 2 (two) times daily.     hyoscyamine (ANASPAZ) 0.125 MG TBDP disintergrating tablet Place 0.125 mg under the tongue every 4 (four) hours as needed (rectal spasm).     hyoscyamine (LEVSIN SL) 0.125 MG SL tablet Place under the tongue.  losartan-hydrochlorothiazide (HYZAAR) 100-25 MG tablet Take 1 tablet by mouth daily.     Melatonin 3 MG CAPS Take by mouth as needed. Reported on 05/22/2015     Melatonin-Tryptophan 3-100 MG CAPS melatonin-tryptophan 3 mg-100 mg capsule     omeprazole (PRILOSEC) 20 MG capsule Take 20 mg by mouth daily.     pantoprazole (PROTONIX) 40 MG tablet Take 1 tablet by mouth daily.     pregabalin  (LYRICA) 50 MG capsule Take 50 mg by mouth 2 (two) times daily.     rosuvastatin (CRESTOR) 5 MG tablet Take 1 tablet by mouth daily.     tamsulosin (FLOMAX) 0.4 MG CAPS capsule Take 0.4 mg by mouth daily.     zolpidem (AMBIEN) 10 MG tablet Take 10 mg by mouth at bedtime as needed for sleep.     Current Facility-Administered Medications  Medication Dose Route Frequency Provider Last Rate Last Admin   leuprolide (6 Month) (ELIGARD) injection 45 mg  45 mg Subcutaneous Once Stoioff, Scott C, MD        ECOG PERFORMANCE STATUS:  0 - Asymptomatic  REVIEW OF SYSTEMS: Patient denies any weight loss, fatigue, weakness, fever, chills or night sweats. Patient denies any loss of vision, blurred vision. Patient denies any ringing  of the ears or hearing loss. No irregular heartbeat. Patient denies heart murmur or history of fainting. Patient denies any chest pain or pain radiating to her upper extremities. Patient denies any shortness of breath, difficulty breathing at night, cough or hemoptysis. Patient denies any swelling in the lower legs. Patient denies any nausea vomiting, vomiting of blood, or coffee ground material in the vomitus. Patient denies any stomach pain. Patient states has had normal bowel movements no significant constipation or diarrhea. Patient denies any dysuria, hematuria or significant nocturia. Patient denies any problems walking, swelling in the joints or loss of balance. Patient denies any skin changes, loss of hair or loss of weight. Patient denies any excessive worrying or anxiety or significant depression. Patient denies any problems with insomnia. Patient denies excessive thirst, polyuria, polydipsia. Patient denies any swollen glands, patient denies easy bruising or easy bleeding. Patient denies any recent infections, allergies or URI. Patient "s visual fields have not changed significantly in recent time.   PHYSICAL EXAM: BP 125/76   Pulse 69   Temp 97.6 F (36.4 C) (Tympanic)    Resp 20   Ht 5\' 4"  (1.626 m)   Wt 248 lb (112.5 kg)   BMI 42.57 kg/m  Slightly obese male in NAD.  Well-developed well-nourished patient in NAD. HEENT reveals PERLA, EOMI, discs not visualized.  Oral cavity is clear. No oral mucosal lesions are identified. Neck is clear without evidence of cervical or supraclavicular adenopathy. Lungs are clear to A&P. Cardiac examination is essentially unremarkable with regular rate and rhythm without murmur rub or thrill. Abdomen is benign with no organomegaly or masses noted. Motor sensory and DTR levels are equal and symmetric in the upper and lower extremities. Cranial nerves II through XII are grossly intact. Proprioception is intact. No peripheral adenopathy or edema is identified. No motor or sensory levels are noted. Crude visual fields are within normal range.  LABORATORY DATA: Pathology reports reviewed    RADIOLOGY RESULTS: Volume study performed   IMPRESSION: Gleason 7 (4+3) adenocarcinoma the prostate in 67 year old male for I-125 interstitial implant.  PLAN: Patient and volume study performed.  Risks and benefits of treatment including radiation safety precautions as well as increased lower urinary tract symptoms fatigue  alteration of blood counts possible diarrhea all were explained in detail to the patient.  He comprehends her recommendations well and consent was signed.  Carmina Miller, MD

## 2023-01-19 NOTE — H&P (View-Only) (Signed)
NEW PATIENT EVALUATION  Name: Ruben [name]  MRN: [id number]  Date:   01/19/2023     DOB: [birth year]   This 67 y.o. 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  REFERRING PHYSICIAN: Marina Goodell, MD  CHIEF COMPLAINT:  Chief Complaint  Patient presents with   Prostate Cancer    DIAGNOSIS: The encounter diagnosis was Prostate cancer [facility]).   PREVIOUS INVESTIGATIONS:  Volume study performed Pathology reports reviewed Clinical notes reviewed  HPI: Patient is a 67 year old male who presents with a slightly elevated PSA of 4.38 back in February 2024.  MRI scan showed PI-RADS category 4 lesion bilaterally with no evidence of trans capsular spread or seminal vesicle involvement.  1 out of 12 cores was positive for Gleason 7 (4+3) adenocarcinoma.  He opted for I-125 interstitial implant and is taken to the OR today for that.  He is otherwise doing well with minimal complaints.  PLANNED TREATMENT REGIMEN: I-125 interstitial implant  PAST MEDICAL HISTORY:  has a past medical history of Arthritis, BPH (benign prostatic hyperplasia), Cataract, Hypertension, Left leg DVT (HCC) (10/2014), Obesity, Restless leg, Right leg DVT (HCC) (01/2015), Sleep apnea, and Varicose veins.    PAST SURGICAL HISTORY:  Past Surgical History:  Procedure Laterality Date   COLONOSCOPY WITH PROPOFOL [name] 06/28/2018   Procedure: COLONOSCOPY WITH PROPOFOL;  Surgeon: Toledo, Boykin Nearing, MD;  Location: [facility] ENDOSCOPY;  Service: Gastroenterology;  Laterality: [name];   ELBOW ARTHROSCOPY Bilateral    KNEE ARTHROPLASTY Left 2005, 1983   KNEE ARTHROSCOPY Right 09/16/2014   Procedure: ARTHROSCOPY KNEE-partial menisectomy;  Surgeon: Donato Heinz, MD;  Location: [facility] ORS;  Service: Orthopedics;  Laterality: Right;   NECK SURGERY     cervical fusion   PERIPHERAL  VASCULAR CATHETERIZATION [name] 01/29/2015   Procedure: IVC Filter Insertion;  Surgeon: Annice Needy, MD;  Location: [facility] INVASIVE CV LAB;  Service: Cardiovascular;  Laterality: [name];    FAMILY HISTORY: family history includes Deep vein thrombosis in his paternal uncle; Heart disease in an other family member; Hypertension in an other family member; Liver cancer (age of onset: 88) in his father; Liver disease in an other family member; Lung cancer (age of onset: 57) in his mother; Myelodysplastic syndrome in his paternal uncle; Other (age of onset: 15) in his father.  SOCIAL HISTORY:  reports that he has quit smoking. His smoking use included cigarettes. He has a 10 pack-year smoking history. He has quit using smokeless tobacco. He reports current alcohol use of about 1.0 standard drink of alcohol per week. He reports that he does not use drugs.  ALLERGIES: Aleve [naproxen], Diclofenac, Diclofenac-misoprostol, Ibuprofen, Lodine [etodolac], and Turmeric  MEDICATIONS:  Current Outpatient Medications  Medication Sig Dispense Refill   amLODipine (NORVASC) 2.5 MG tablet Take by mouth.     aspirin 81 MG EC tablet Take by mouth.     baclofen (LIORESAL) 10 MG tablet Take 10 mg by mouth 2 (two) times daily at 10 AM and 5 PM.      celecoxib (CELEBREX) 200 MG capsule Take 1 capsule by mouth 2 (two) times daily.     hyoscyamine (ANASPAZ) 0.125 MG TBDP disintergrating tablet Place 0.125 mg under the tongue every 4 (four) hours as needed (rectal spasm).     hyoscyamine (LEVSIN SL) 0.125 MG SL tablet Place under the tongue.  losartan-hydrochlorothiazide (HYZAAR) 100-25 MG tablet Take 1 tablet by mouth daily.     Melatonin 3 MG CAPS Take by mouth as needed. Reported on 05/22/2015     Melatonin-Tryptophan 3-100 MG CAPS melatonin-tryptophan 3 mg-100 mg capsule     omeprazole (PRILOSEC) 20 MG capsule Take 20 mg by mouth daily.     pantoprazole (PROTONIX) 40 MG tablet Take 1 tablet by mouth daily.     pregabalin  (LYRICA) 50 MG capsule Take 50 mg by mouth 2 (two) times daily.     rosuvastatin (CRESTOR) 5 MG tablet Take 1 tablet by mouth daily.     tamsulosin (FLOMAX) 0.4 MG CAPS capsule Take 0.4 mg by mouth daily.     zolpidem (AMBIEN) 10 MG tablet Take 10 mg by mouth at bedtime as needed for sleep.     Current Facility-Administered Medications  Medication Dose Route Frequency Provider Last Rate Last Admin   leuprolide (6 Month) (ELIGARD) injection 45 mg  45 mg Subcutaneous Once Stoioff, Scott C, MD        ECOG PERFORMANCE STATUS:  0 - Asymptomatic  REVIEW OF SYSTEMS: Patient denies any weight loss, fatigue, weakness, fever, chills or night sweats. Patient denies any loss of vision, blurred vision. Patient denies any ringing  of the ears or hearing loss. No irregular heartbeat. Patient denies heart murmur or history of fainting. Patient denies any chest pain or pain radiating to her upper extremities. Patient denies any shortness of breath, difficulty breathing at night, cough or hemoptysis. Patient denies any swelling in the lower legs. Patient denies any nausea vomiting, vomiting of blood, or coffee ground material in the vomitus. Patient denies any stomach pain. Patient states has had normal bowel movements no significant constipation or diarrhea. Patient denies any dysuria, hematuria or significant nocturia. Patient denies any problems walking, swelling in the joints or loss of balance. Patient denies any skin changes, loss of hair or loss of weight. Patient denies any excessive worrying or anxiety or significant depression. Patient denies any problems with insomnia. Patient denies excessive thirst, polyuria, polydipsia. Patient denies any swollen glands, patient denies easy bruising or easy bleeding. Patient denies any recent infections, allergies or URI. Patient "s visual fields have not changed significantly in recent time.   PHYSICAL EXAM: BP 125/76   Pulse 69   Temp 97.6 F (36.4 C) (Tympanic)    Resp 20   Ht 5\' 4"  (1.626 m)   Wt 248 lb (112.5 kg)   BMI 42.57 kg/m  Slightly obese male in NAD.  Well-developed well-nourished patient in NAD. HEENT reveals PERLA, EOMI, discs not visualized.  Oral cavity is clear. No oral mucosal lesions are identified. Neck is clear without evidence of cervical or supraclavicular adenopathy. Lungs are clear to A&P. Cardiac examination is essentially unremarkable with regular rate and rhythm without murmur rub or thrill. Abdomen is benign with no organomegaly or masses noted. Motor sensory and DTR levels are equal and symmetric in the upper and lower extremities. Cranial nerves II through XII are grossly intact. Proprioception is intact. No peripheral adenopathy or edema is identified. No motor or sensory levels are noted. Crude visual fields are within normal range.  LABORATORY DATA: Pathology reports reviewed    RADIOLOGY RESULTS: Volume study performed   IMPRESSION: Gleason 7 (4+3) adenocarcinoma the prostate in 67 year old male for I-125 interstitial implant.  PLAN: Patient and volume study performed.  Risks and benefits of treatment including radiation safety precautions as well as increased lower urinary tract symptoms fatigue  alteration of blood counts possible diarrhea all were explained in detail to the patient.  He comprehends her recommendations well and consent was signed.  Carmina Miller, MD

## 2023-01-19 NOTE — Progress Notes (Signed)
Radiation Oncology Volume study note  Name: Ruben [name]   Date:   01/19/2023 MRN:  [id number] DOB: February 25, 1956    This 67 y.o. male presents to the clinic today for volume study in anticipation of I-125 interstitial implant.  REFERRING PROVIDER: Marina Goodell, MD  HPI: Patient is a 67 year old male who presents with a slightly elevated PSA of 4.38 back in February 2024. MRI scan showed PI-RADS category 4 lesion bilaterally with no evidence of trans capsular spread or seminal vesicle involvement. 1 out of 12 cores was positive for Gleason 7 (4+3) adenocarcinoma. He opted for I-125 interstitial implant and is taken to the OR today for that. He is otherwise doing well with minimal complaints. .  COMPLICATIONS OF TREATMENT: none  FOLLOW UP COMPLIANCE: keeps appointments   PHYSICAL EXAM:  There were no vitals taken for this visit. Well-developed well-nourished patient in NAD. HEENT reveals PERLA, EOMI, discs not visualized.  Oral cavity is clear. No oral mucosal lesions are identified. Neck is clear without evidence of cervical or supraclavicular adenopathy. Lungs are clear to A&P. Cardiac examination is essentially unremarkable with regular rate and rhythm without murmur rub or thrill. Abdomen is benign with no organomegaly or masses noted. Motor sensory and DTR levels are equal and symmetric in the upper and lower extremities. Cranial nerves II through XII are grossly intact. Proprioception is intact. No peripheral adenopathy or edema is identified. No motor or sensory levels are noted. Crude visual fields are within normal range.  RADIOLOGY RESULTS: Ultrasound used for volume study  PLAN: Patient was taken to the cystoscopy suite in the OR. Patient was placed in the low lithotomy position. Foley catheter was placed. Trans-rectal ultrasound probe was inserted into the rectum and prostate seminal vesicles were visualized as well as bladder base. stepping images were performed on a 5 mm  increments. Images will be placed in BrachyVision treatment planning system to determine seed placement coordinates for eventual I-125 interstitial implant. Images will be reviewed with the physics and dosimetry staff for final quality approval. I personally was present for the volume study and assisted in delineation of contour volumes.  At the end of the procedure Foley catheter was removed, rectal ultrasound probe was removed. Patient tolerated his procedures extremely well with no side effects or complaints. Patient has given appointment for interstitial implant date. Consent was signed today as well as history and physical performed in preparation for his outpatient surgical implant.     Carmina Miller, MD

## 2023-01-25 ENCOUNTER — Other Ambulatory Visit: Payer: Self-pay | Admitting: Radiation Oncology

## 2023-01-25 DIAGNOSIS — C61 Malignant neoplasm of prostate: Secondary | ICD-10-CM

## 2023-02-02 ENCOUNTER — Ambulatory Visit
Admission: RE | Admit: 2023-02-02 | Discharge: 2023-02-02 | Disposition: A | Discharge status: Home / Self Care | Payer: Medicare Other | Source: Ambulatory Visit | Attending: Radiation Oncology | Admitting: Radiation Oncology

## 2023-02-02 DIAGNOSIS — C61 Malignant neoplasm of prostate: Secondary | ICD-10-CM | POA: Insufficient documentation

## 2023-02-03 ENCOUNTER — Other Ambulatory Visit: Payer: Self-pay

## 2023-02-03 ENCOUNTER — Encounter
Admission: RE | Admit: 2023-02-03 | Discharge: 2023-02-03 | Disposition: A | Discharge status: Home / Self Care | Payer: Medicare Other | Source: Ambulatory Visit | Attending: Urology | Admitting: Urology

## 2023-02-03 VITALS — Ht 64.0 in | Wt 240.0 lb

## 2023-02-03 DIAGNOSIS — E119 Type 2 diabetes mellitus without complications: Secondary | ICD-10-CM

## 2023-02-03 DIAGNOSIS — Z01812 Encounter for preprocedural laboratory examination: Secondary | ICD-10-CM

## 2023-02-03 DIAGNOSIS — I10 Essential (primary) hypertension: Secondary | ICD-10-CM

## 2023-02-03 HISTORY — DX: Pneumonia, unspecified organism: J18.9

## 2023-02-03 HISTORY — DX: Pure hypercholesterolemia, unspecified: E78.00

## 2023-02-03 HISTORY — DX: Complex regional pain syndrome I, unspecified: G90.50

## 2023-02-03 HISTORY — DX: Malignant neoplasm of prostate: C61

## 2023-02-03 HISTORY — DX: Gastro-esophageal reflux disease without esophagitis: K21.9

## 2023-02-03 HISTORY — DX: Carpal tunnel syndrome, bilateral upper limbs: G56.03

## 2023-02-03 HISTORY — DX: Anal spasm: K59.4

## 2023-02-03 HISTORY — DX: Obstructive sleep apnea (adult) (pediatric): G47.33

## 2023-02-03 HISTORY — DX: Type 2 diabetes mellitus without complications: E11.9

## 2023-02-03 HISTORY — DX: Morbid (severe) obesity due to excess calories: E66.01

## 2023-02-03 NOTE — Patient Instructions (Addendum)
Your procedure is scheduled on: Tuesday, October 8 Report to the Registration Desk on the 1st floor of the [facility] To find out your arrival time, please call [phone] between 1PM - 3PM on: Monday, October 7 If your arrival time is 6:00 am, do not arrive before that time as the [facility] entrance doors do not open until 6:00 am.  REMEMBER: Instructions that are not followed completely may result in serious medical risk, up to and including death; or upon the discretion of your surgeon and anesthesiologist your surgery may need to be rescheduled.  Do not eat or drink after midnight the night before surgery.  No gum chewing or hard candies.  One week prior to surgery: starting today, October 3 Stop Anti-inflammatories (NSAIDS) such as Advil, Aleve, Ibuprofen, Motrin, Naproxen, Naprosyn and Aspirin based products such as Excedrin, Goody's Powder, BC Powder. Stop ANY OVER THE COUNTER supplements until after surgery. You may however, continue to take Tylenol if needed for pain up until the day of surgery.  Continue taking all prescribed medications.  TAKE ONLY THESE MEDICATIONS THE MORNING OF SURGERY WITH A SIP OF WATER:  Amlodipine Pantoprazole (Protonix) - take one the night before surgery and one the morning of surgery; helps prevent nausea after surgery. Pregabalin (Lyrica) Tamsulosin (Flomax)  Fleets enema the morning of surgery as directed.  No Alcohol for 24 hours before or after surgery.  No Smoking including e-cigarettes for 24 hours before surgery.  No chewable tobacco products for at least 6 hours before surgery.  No nicotine patches on the day of surgery.  Do not use any "recreational" drugs for at least a week (preferably 2 weeks) before your surgery.  Please be advised that the combination of cocaine and anesthesia may have negative outcomes, up to and including death. If you test positive for cocaine, your surgery will be cancelled.  On the morning of  surgery brush your teeth with toothpaste and water, you may rinse your mouth with mouthwash if you wish. Do not swallow any toothpaste or mouthwash.  Do not wear jewelry, make-up, hairpins, clips or nail polish.  For welded (permanent) jewelry: bracelets, anklets, waist bands, etc.  Please have this removed prior to surgery.  If it is not removed, there is a chance that hospital personnel will need to cut it off on the day of surgery.  Do not wear lotions, powders, or perfumes.   Do not shave body hair from the neck down 48 hours before surgery.  Contact lenses, hearing aids and dentures may not be worn into surgery.  Do not bring valuables to the hospital. [facility] is not responsible for any missing/lost belongings or valuables.   Bring your C-PAP to the hospital in case you may have to spend the night.   Notify your doctor if there is any change in your medical condition (cold, fever, infection).  Wear comfortable clothing (specific to your surgery type) to the hospital.  After surgery, you can help prevent lung complications by doing breathing exercises.  Take deep breaths and cough every 1-2 hours.   If you are being discharged the day of surgery, you will not be allowed to drive home. You will need a responsible individual to drive you home and stay with you for 24 hours after surgery.   If you are taking public transportation, you will need to have a responsible individual with you.  Please call the Pre-admissions Testing Dept. at [phone] if you have any questions about  these instructions.  Surgery Visitation Policy:  Patients having surgery or a procedure may have two visitors.  Children under the age of 46 must have an adult with them who is not the patient.

## 2023-02-04 ENCOUNTER — Encounter
Admission: RE | Admit: 2023-02-04 | Discharge: 2023-02-04 | Disposition: A | Discharge status: Home / Self Care | Payer: Medicare Other | Source: Ambulatory Visit | Attending: Urology | Admitting: Urology

## 2023-02-04 DIAGNOSIS — I10 Essential (primary) hypertension: Secondary | ICD-10-CM | POA: Diagnosis not present

## 2023-02-04 DIAGNOSIS — Z01818 Encounter for other preprocedural examination: Secondary | ICD-10-CM | POA: Diagnosis present

## 2023-02-04 DIAGNOSIS — E119 Type 2 diabetes mellitus without complications: Secondary | ICD-10-CM | POA: Insufficient documentation

## 2023-02-04 DIAGNOSIS — Z01812 Encounter for preprocedural laboratory examination: Secondary | ICD-10-CM

## 2023-02-04 DIAGNOSIS — Z01810 Encounter for preprocedural cardiovascular examination: Secondary | ICD-10-CM | POA: Diagnosis not present

## 2023-02-04 LAB — CBC
HCT: 37.9 % — ABNORMAL LOW (ref 39.0–52.0)
Hemoglobin: 12 g/dL — ABNORMAL LOW (ref 13.0–17.0)
MCH: 26.3 pg (ref 26.0–34.0)
MCHC: 31.7 g/dL (ref 30.0–36.0)
MCV: 82.9 fL (ref 80.0–100.0)
Platelets: 213 10*3/uL (ref 150–400)
RBC: 4.57 MIL/uL (ref 4.22–5.81)
RDW: 14.2 % (ref 11.5–15.5)
WBC: 5.7 10*3/uL (ref 4.0–10.5)
nRBC: 0 % (ref 0.0–0.2)

## 2023-02-07 MED ORDER — CEFAZOLIN SODIUM-DEXTROSE 2-4 GM/100ML-% IV SOLN
2.0000 g | Freq: Once | INTRAVENOUS | Status: AC
  Administered 2023-02-08: 2 g via INTRAVENOUS

## 2023-02-07 MED ORDER — FLEET ENEMA RE ENEM: 1.0000 | ENEMA | Freq: Once | RECTAL | Status: DC

## 2023-02-07 MED ORDER — ORAL CARE MOUTH RINSE: 15.0000 mL | Freq: Once | OROMUCOSAL | Status: AC

## 2023-02-07 MED ORDER — SODIUM CHLORIDE 0.9 % IV SOLN: INTRAVENOUS | Status: DC

## 2023-02-07 MED ORDER — CHLORHEXIDINE GLUCONATE 0.12 % MT SOLN
15.0000 mL | Freq: Once | OROMUCOSAL | Status: AC
  Administered 2023-02-08: 15 mL via OROMUCOSAL

## 2023-02-08 ENCOUNTER — Ambulatory Visit: Payer: Self-pay | Admitting: Urgent Care

## 2023-02-08 ENCOUNTER — Ambulatory Visit
Admission: RE | Admit: 2023-02-08 | Discharge: 2023-02-08 | Disposition: A | Discharge status: Home / Self Care | Payer: Medicare Other | Attending: Urology | Admitting: Urology

## 2023-02-08 ENCOUNTER — Ambulatory Visit: Payer: Medicare Other

## 2023-02-08 ENCOUNTER — Encounter
Admission: RE | Disposition: A | Discharge status: Home / Self Care | Payer: Self-pay | Source: Home / Self Care | Attending: Urology

## 2023-02-08 ENCOUNTER — Ambulatory Visit: Payer: Medicare Other | Admitting: Certified Registered"

## 2023-02-08 ENCOUNTER — Encounter: Payer: Self-pay | Admitting: Urology

## 2023-02-08 ENCOUNTER — Other Ambulatory Visit: Payer: Self-pay

## 2023-02-08 DIAGNOSIS — Z87891 Personal history of nicotine dependence: Secondary | ICD-10-CM | POA: Diagnosis not present

## 2023-02-08 DIAGNOSIS — E119 Type 2 diabetes mellitus without complications: Secondary | ICD-10-CM | POA: Insufficient documentation

## 2023-02-08 DIAGNOSIS — C61 Malignant neoplasm of prostate: Secondary | ICD-10-CM | POA: Insufficient documentation

## 2023-02-08 DIAGNOSIS — G4730 Sleep apnea, unspecified: Secondary | ICD-10-CM | POA: Diagnosis not present

## 2023-02-08 DIAGNOSIS — Z801 Family history of malignant neoplasm of trachea, bronchus and lung: Secondary | ICD-10-CM | POA: Insufficient documentation

## 2023-02-08 DIAGNOSIS — Z86718 Personal history of other venous thrombosis and embolism: Secondary | ICD-10-CM | POA: Insufficient documentation

## 2023-02-08 DIAGNOSIS — N400 Enlarged prostate without lower urinary tract symptoms: Secondary | ICD-10-CM | POA: Insufficient documentation

## 2023-02-08 DIAGNOSIS — Z800 Family history of malignant neoplasm of digestive organs: Secondary | ICD-10-CM | POA: Insufficient documentation

## 2023-02-08 DIAGNOSIS — Z8249 Family history of ischemic heart disease and other diseases of the circulatory system: Secondary | ICD-10-CM | POA: Diagnosis not present

## 2023-02-08 DIAGNOSIS — Z6841 Body Mass Index (BMI) 40.0 and over, adult: Secondary | ICD-10-CM | POA: Insufficient documentation

## 2023-02-08 DIAGNOSIS — I10 Essential (primary) hypertension: Secondary | ICD-10-CM | POA: Insufficient documentation

## 2023-02-08 DIAGNOSIS — Z01812 Encounter for preprocedural laboratory examination: Secondary | ICD-10-CM

## 2023-02-08 HISTORY — PX: RADIOACTIVE SEED IMPLANT: SHX5150

## 2023-02-08 LAB — GLUCOSE, CAPILLARY
Glucose-Capillary: 112 mg/dL — ABNORMAL HIGH (ref 70–99)
Glucose-Capillary: 152 mg/dL — ABNORMAL HIGH (ref 70–99)

## 2023-02-08 SURGERY — INSERTION, RADIATION SOURCE, PROSTATE
Anesthesia: General | Site: Perineum

## 2023-02-08 MED ORDER — ACETAMINOPHEN 10 MG/ML IV SOLN
INTRAVENOUS | Status: DC | PRN
  Administered 2023-02-08: 1000 mg via INTRAVENOUS

## 2023-02-08 MED ORDER — EPHEDRINE SULFATE (PRESSORS) 50 MG/ML IJ SOLN
INTRAMUSCULAR | Status: DC | PRN
  Administered 2023-02-08: 10 mg via INTRAVENOUS
  Administered 2023-02-08: 5 mg via INTRAVENOUS
  Administered 2023-02-08: 10 mg via INTRAVENOUS

## 2023-02-08 MED ORDER — PHENYLEPHRINE HCL-NACL 20-0.9 MG/250ML-% IV SOLN
INTRAVENOUS | Status: AC
  Filled 2023-02-08: qty 250

## 2023-02-08 MED ORDER — ONDANSETRON HCL 4 MG/2ML IJ SOLN
INTRAMUSCULAR | Status: DC | PRN
  Administered 2023-02-08: 4 mg via INTRAVENOUS

## 2023-02-08 MED ORDER — PROPOFOL 10 MG/ML IV BOLUS
INTRAVENOUS | Status: DC | PRN
  Administered 2023-02-08: 20 mg via INTRAVENOUS
  Administered 2023-02-08: 160 mg via INTRAVENOUS

## 2023-02-08 MED ORDER — SODIUM CHLORIDE 0.9% FLUSH: 3.0000 mL | Freq: Two times a day (BID) | INTRAVENOUS | Status: DC

## 2023-02-08 MED ORDER — ROCURONIUM BROMIDE 100 MG/10ML IV SOLN
INTRAVENOUS | Status: DC | PRN
Start: 2023-02-08 — End: 2023-02-08
  Administered 2023-02-08: 10 mg via INTRAVENOUS

## 2023-02-08 MED ORDER — PHENYLEPHRINE HCL-NACL 20-0.9 MG/250ML-% IV SOLN
INTRAVENOUS | Status: DC | PRN
Start: 2023-02-08 — End: 2023-02-08
  Administered 2023-02-08: 40 ug/min via INTRAVENOUS

## 2023-02-08 MED ORDER — DEXAMETHASONE SODIUM PHOSPHATE 10 MG/ML IJ SOLN
INTRAMUSCULAR | Status: DC | PRN
  Administered 2023-02-08: 10 mg via INTRAVENOUS

## 2023-02-08 MED ORDER — ACETAMINOPHEN 10 MG/ML IV SOLN
INTRAVENOUS | Status: AC
  Filled 2023-02-08: qty 100

## 2023-02-08 MED ORDER — HYDROCODONE-ACETAMINOPHEN 5-325 MG PO TABS
1.0000 | ORAL_TABLET | Freq: Four times a day (QID) | ORAL | 0 refills | Status: AC | PRN
Start: 2023-02-08 — End: ?

## 2023-02-08 MED ORDER — BACITRACIN ZINC 500 UNIT/GM EX OINT
TOPICAL_OINTMENT | CUTANEOUS | Status: AC
  Filled 2023-02-08: qty 28.35

## 2023-02-08 MED ORDER — ALBUTEROL SULFATE HFA 108 (90 BASE) MCG/ACT IN AERS
INHALATION_SPRAY | RESPIRATORY_TRACT | Status: DC | PRN
Start: 2023-02-08 — End: 2023-02-08
  Administered 2023-02-08 (×2): 6 via RESPIRATORY_TRACT

## 2023-02-08 MED ORDER — LIDOCAINE HCL (CARDIAC) PF 100 MG/5ML IV SOSY
PREFILLED_SYRINGE | INTRAVENOUS | Status: DC | PRN
  Administered 2023-02-08: 100 mg via INTRAVENOUS

## 2023-02-08 MED ORDER — SODIUM CHLORIDE 0.9 % IR SOLN
Status: DC | PRN
  Administered 2023-02-08: 1

## 2023-02-08 MED ORDER — FENTANYL CITRATE (PF) 100 MCG/2ML IJ SOLN: 25.0000 ug | INTRAMUSCULAR | Status: DC | PRN

## 2023-02-08 MED ORDER — SODIUM CHLORIDE 0.9 % IV SOLN
INTRAVENOUS | Status: DC | PRN
Start: 2023-02-08 — End: 2023-02-08

## 2023-02-08 MED ORDER — SUGAMMADEX SODIUM 200 MG/2ML IV SOLN
INTRAVENOUS | Status: DC | PRN
Start: 2023-02-08 — End: 2023-02-08
  Administered 2023-02-08: 250 mg via INTRAVENOUS
  Administered 2023-02-08: 150 mg via INTRAVENOUS

## 2023-02-08 MED ORDER — BACITRACIN 500 UNIT/GM EX OINT
TOPICAL_OINTMENT | CUTANEOUS | Status: DC | PRN
  Administered 2023-02-08: 1 via TOPICAL

## 2023-02-08 MED ORDER — FENTANYL CITRATE (PF) 100 MCG/2ML IJ SOLN
INTRAMUSCULAR | Status: DC | PRN
  Administered 2023-02-08: 100 ug via INTRAVENOUS

## 2023-02-08 MED ORDER — FENTANYL CITRATE (PF) 100 MCG/2ML IJ SOLN
INTRAMUSCULAR | Status: AC
  Filled 2023-02-08: qty 2

## 2023-02-08 MED ORDER — CEFAZOLIN SODIUM-DEXTROSE 2-4 GM/100ML-% IV SOLN
INTRAVENOUS | Status: AC
  Filled 2023-02-08: qty 100

## 2023-02-08 MED ORDER — EPHEDRINE 5 MG/ML INJ
INTRAVENOUS | Status: AC
  Filled 2023-02-08: qty 5

## 2023-02-08 MED ORDER — PROPOFOL 10 MG/ML IV BOLUS
INTRAVENOUS | Status: AC
  Filled 2023-02-08: qty 20

## 2023-02-08 MED ORDER — ACETAMINOPHEN 10 MG/ML IV SOLN: 1000.0000 mg | Freq: Once | INTRAVENOUS | Status: DC | PRN

## 2023-02-08 MED ORDER — DROPERIDOL 2.5 MG/ML IJ SOLN: 0.6250 mg | Freq: Once | INTRAMUSCULAR | Status: DC | PRN

## 2023-02-08 MED ORDER — CHLORHEXIDINE GLUCONATE 0.12 % MT SOLN
OROMUCOSAL | Status: AC
  Filled 2023-02-08: qty 15

## 2023-02-08 MED ORDER — OXYCODONE HCL 5 MG/5ML PO SOLN: 5.0000 mg | Freq: Once | ORAL | Status: DC | PRN

## 2023-02-08 MED ORDER — OXYCODONE HCL 5 MG PO TABS: 5.0000 mg | ORAL_TABLET | Freq: Once | ORAL | Status: DC | PRN

## 2023-02-08 MED ORDER — LIDOCAINE HCL (PF) 2 % IJ SOLN
INTRAMUSCULAR | Status: AC
  Filled 2023-02-08: qty 5

## 2023-02-08 MED ORDER — SUCCINYLCHOLINE CHLORIDE 200 MG/10ML IV SOSY
PREFILLED_SYRINGE | INTRAVENOUS | Status: DC | PRN
Start: 2023-02-08 — End: 2023-02-08
  Administered 2023-02-08: 140 mg via INTRAVENOUS

## 2023-02-08 MED ORDER — STERILE WATER FOR IRRIGATION IR SOLN
Status: DC | PRN
Start: 2023-02-08 — End: 2023-02-08
  Administered 2023-02-08: 500 mL

## 2023-02-08 SURGICAL SUPPLY — 23 items
BAG DRN RND TRDRP ANRFLXCHMBR (UROLOGICAL SUPPLIES) ×1
BAG URINE DRAIN 2000ML AR STRL (UROLOGICAL SUPPLIES) ×1 IMPLANT
BLADE CLIPPER SURG (BLADE) ×1 IMPLANT
CATH FOL 2WAY LX 16X5 (CATHETERS) ×1 IMPLANT
DRAPE INCISE 23X17 STRL (DRAPES) ×1 IMPLANT
DRAPE INCISE IOBAN 23X17 STRL (DRAPES) ×1 IMPLANT
DRAPE TABLE BACK 80X90 (DRAPES) ×1 IMPLANT
DRAPE UNDER BUTTOCK W/FLU (DRAPES) ×1 IMPLANT
DRSG TELFA 3X8 NADH STRL (GAUZE/BANDAGES/DRESSINGS) ×1 IMPLANT
DRSG TELFA 4X3 1S NADH ST (GAUZE/BANDAGES/DRESSINGS) IMPLANT
GLOVE BIO SURGEON STRL SZ7.5 (GLOVE) ×2 IMPLANT
GLOVE BIOGEL PI IND STRL 7.5 (GLOVE) ×1 IMPLANT
GOWN STRL REUS W/ TWL LRG LVL3 (GOWN DISPOSABLE) ×2 IMPLANT
GOWN STRL REUS W/ TWL XL LVL3 (GOWN DISPOSABLE) ×2 IMPLANT
GOWN STRL REUS W/TWL LRG LVL3 (GOWN DISPOSABLE) ×2
GOWN STRL REUS W/TWL XL LVL3 (GOWN DISPOSABLE) ×2
KIT TURNOVER CYSTO (KITS) ×1 IMPLANT
PACK CYSTO AR (MISCELLANEOUS) ×1 IMPLANT
SET CYSTO W/LG BORE CLAMP LF (SET/KITS/TRAYS/PACK) ×1 IMPLANT
SURGILUBE 2OZ TUBE FLIPTOP (MISCELLANEOUS) ×1 IMPLANT
SYR 10ML LL (SYRINGE) ×1 IMPLANT
WATER STERILE IRR 1000ML POUR (IV SOLUTION) ×1 IMPLANT
WATER STERILE IRR 500ML POUR (IV SOLUTION) ×1 IMPLANT

## 2023-02-08 NOTE — Transfer of Care (Signed)
Immediate Anesthesia Transfer of Care Note  Patient: MARVION BASTIDAS  Procedure(s) Performed: RADIOACTIVE SEED IMPLANT/BRACHYTHERAPY IMPLANT (64 SEEDS) (Perineum)  Patient Location: PACU  Anesthesia Type:General  Level of Consciousness: awake, alert , and oriented  Airway & Oxygen Therapy: Patient Spontanous Breathing and Patient connected to face mask oxygen  Post-op Assessment: Report given to RN and Post -op Vital signs reviewed and stable  Post vital signs: stable  Last Vitals:  Vitals Value Taken Time  BP 128/74 02/08/23 0841  Temp 36.4 C 02/08/23 0841  Pulse 66 02/08/23 0845  Resp 22 02/08/23 0845  SpO2 100 % 02/08/23 0845  Vitals shown include unfiled device data.  Last Pain:  Vitals:   02/08/23 0841  TempSrc:   PainSc: 0-No pain         Complications: No notable events documented.

## 2023-02-08 NOTE — Interval H&P Note (Signed)
History and Physical Interval Note:  02/08/2023 7:25 AM  Ruben [name]  has presented today for surgery, with the diagnosis of Prostate Cancer.  The various methods of treatment have been discussed with the patient and family. After consideration of risks, benefits and other options for treatment, the patient has consented to  Procedure(s): RADIOACTIVE SEED IMPLANT/BRACHYTHERAPY IMPLANT ([name]) as a surgical intervention.  The patient's history has been reviewed, patient examined, no change in status, stable for surgery.  I have reviewed the patient's chart and labs.  Questions were answered to the patient's satisfaction.    CV:RRR Lungs:clear  Ruben [name]

## 2023-02-08 NOTE — Discharge Instructions (Addendum)
Brachytherapy for Prostate Cancer, Care After  The following information offers guidance on how to care for yourself after your procedure. Your health care provider may also give you more specific instructions. If you have problems or questions, contact your health care provider. What can I expect after the procedure? After the procedure, it is common to have: Urinary symptoms. These may include: Trouble urinating. Blood in the urine or semen. Frequent feeling of an urgent need to urinate. Constipation, nausea, or bloating and gas. Bruising, swelling, and tenderness of the area beneath the scrotum (perineum). Burning or pain in the rectum. Problems getting or keeping an erection (erectile dysfunction). After the thin, flexible urinary tube (Foley catheter)is removed, it is common to have: Burning when you urinate. This may last for up to 2 weeks after the catheter is removed. Trouble controlling when you urinate (incontinence). You may leak urine sometimes. Your ability to control when you urinate should improve as you heal. Follow these instructions at home: Eating and drinking Follow instructions from your health care provider about eating or drinking restrictions. Drink enough fluid to keep your urine pale yellow. Treatment area care Check your treatment area every day for signs of infection. Watch for: Redness, swelling, or pain. New drainage or blood. Some dried fluid or blood may be present. Warmth. Pus or a bad smell. Managing pain and swelling If directed, put ice on the affected area. To do this: Put ice in a plastic bag. Place a towel between your skin and the bag. Leave the ice on for 20 minutes at a time, 2-3 times a day. Be sure to remove the ice if your skin turns bright red. If you cannot feel pain, heat, or cold, you have a greater risk of damage to the area. Try not to sit directly on the perineum. A soft cushion can help with discomfort. Activity Do not lift  anything that is heavier than 10 lb (4.5 kg) for 48 hours Rest as told by your health care provider. Return to your normal activities as told by your health care provider. Ask your health care provider what activities are safe for you. Most people can return to normal activities within a few days. If you were given a sedative during the procedure, it can affect you for several hours. Do not drive or operate machinery until your health care provider says that it is safe. Follow instructions from your health care provider about when it is safe for you to engage in sexual activity. Managing constipation Your procedure may cause constipation. To prevent or treat constipation, you may need to: Take over-the-counter or prescription medicines. Eat foods that are high in fiber, such as beans, whole grains, and fresh fruits and vegetables. Limit foods that are high in fat and processed sugars, such as fried or sweet foods. General instructions Take over-the-counter and prescription medicines only as told by your health care provider. You may shower in 24 hours Do not strain to have a bowel movement. Do not use any products that contain nicotine or tobacco. These products include cigarettes, chewing tobacco, and vaping devices, such as e-cigarettes. If you need help quitting, ask your health care provider. If you have permanent, low-dose brachytherapy implants: Limit close contact with children and pregnant women for 2 months or as told by your health care provider. This is important because of the active radiation in your prostate. You may set off radioactive sensors, such as at airport screenings. Ask your health care provider for a document  that explains your treatment. You may be told to use a condom during sex for the first 2 months after low-dose brachytherapy. In rare cases, a seed can move and come out in semen. A condom keeps it from going into your partner. Keep all follow-up visits because you  may need more treatment. A prescription for pain medication was sent to your pharmacy if needed You may take over-the-counter AZO for burning if needed for burning with urination Contact a health care provider if you: Have a fever or chills. Have any of these signs of infection in the treatment area: Redness, swelling, or pain that is new or gets worse. Fluid or blood. Warmth. Pus or a bad smell. Have no bowel movements for 3-4 days after the procedure. Have diarrhea for 3-4 days after the procedure. Develop any new symptoms, such as problems passing urine or erectile dysfunction. Have pain in your abdomen. Have more blood in your urine, have urine that is cloudy or smells bad, or have pain or burning when you urinate. Have swelling or pain in your legs. Get help right away if: You cannot urinate. You have a lot of bleeding from your rectum. You have unusual drainage coming from your rectum. You have pain in the treated area that does not go away with pain medicine or gets worse. You have severe nausea or vomiting. You have trouble breathing. Summary Talk with your health care provider about your risk of brachytherapy side effects, such as erectile dysfunction or urinary problems. If you have permanent, low-dose brachytherapy implants, limit close contact with children and pregnant women for 2 months or as told by your health care provider. You may be told to use a condom during sex for the first 2 months after low-dose brachytherapy. Keep all follow-up visits because you may need more treatment. This information is not intended to replace advice given to you by your health care provider. Make sure you discuss any questions you have with your health care provider. Document Revised: 07/16/2020 Document Reviewed: 07/16/2020 Elsevier Patient Education  2024 [employer]   AMBULATORY SURGERY  DISCHARGE INSTRUCTIONS   The drugs that you were given will stay in your system until  tomorrow so for the next 24 hours you should not:  Drive an automobile Make any legal decisions Drink any alcoholic beverage   You may resume regular meals tomorrow.  Today it is better to start with liquids and gradually work up to solid foods.  You may eat anything you prefer, but it is better to start with liquids, then soup and crackers, and gradually work up to solid foods.   Please notify your doctor immediately if you have any unusual bleeding, trouble breathing, redness and pain at the surgery site, drainage, fever, or pain not relieved by medication.    Additional Instructions:   Please contact your physician with any problems or Same Day Surgery at [phone], Monday through Friday 6 am to 4 pm, or  at [facility] number at [phone].

## 2023-02-08 NOTE — Op Note (Signed)
   Preoperative diagnosis: Adenocarcinoma of the prostate   Postoperative diagnosis: Adenocarcinoma the prostate  Procedure: I-125 prostate interstitial implant, cystoscopy  Surgeon: Irineo Axon, M.D.   Radiation oncologist: Joen Laura, M.D.   Anesthesia: General  Drains: none  Complications: none  Indications: Indication: Ruben [name] is a 67 y.o. patient with intermediate risk prostate cancer.  After reviewing the management options for treatment with radiation oncology, he elected to proceed with the above surgical procedure(s). We have discussed the potential benefits and risks of the procedure, side effects of the proposed treatment, the likelihood of the patient achieving the goals of the procedure, and any potential problems that might occur during the procedure or recuperation. Informed consent has been obtained.  Procedure:  The patient was taken to the operating room and general anesthesia was induced.  The patient was placed in the dorsal lithotomy position, prepped and draped in the usual sterile fashion, and preoperative antibiotics were administered. A preoperative time-out was performed.   Radiation oncology department placed a transrectal ultrasound probe anchoring stand/ grid and aligned with previous imaging from the volume study. Foley catheter was inserted without difficulty.  All needle passage was done with real-time transrectal ultrasound guidance in both the transverse and sagittal plains in order to achieve the desired preplanned position. A total of 22 needles were placed.  64 active seeds were implanted. The Foley catheter was removed and a rigid cystoscopy failed to show any seeds outside the prostate without evidence of trauma to the urethral, prostatic fossa, or bladder.  The bladder was drained.  A fluoroscopic image was then obtained showing excellent distrubution of the brachytherapy seeds.  Each seed was counted and counts were correct.    After  anesthetic reversal he was transported to the PACU in stable condition.  Plan: Void prior to discharge Postop follow-up 1 month   Irineo Axon, MD

## 2023-02-08 NOTE — Anesthesia Preprocedure Evaluation (Signed)
Anesthesia Evaluation  Patient identified by MRN, date of birth, ID band Patient awake    Reviewed: Allergy & Precautions, H&P , NPO status , Patient's Chart, lab work & pertinent test results, reviewed documented beta blocker date and time   Airway Mallampati: III  TM Distance: >3 FB Neck ROM: full    Dental  (+) Teeth Intact   Pulmonary neg pulmonary ROS, sleep apnea and Continuous Positive Airway Pressure Ventilation , pneumonia, former smoker   Pulmonary exam normal        Cardiovascular Exercise Tolerance: Poor hypertension, On Medications negative cardio ROS Normal cardiovascular exam Rhythm:regular Rate:Normal     Neuro/Psych  Neuromuscular disease negative neurological ROS  negative psych ROS   GI/Hepatic negative GI ROS, Neg liver ROS,GERD  Medicated,,  Endo/Other  diabetes, Well Controlled  Morbid obesity  Renal/GU negative Renal ROS  negative genitourinary   Musculoskeletal   Abdominal   Peds  Hematology negative hematology ROS (+)   Anesthesia Other Findings Past Medical History: No date: Adenocarcinoma of prostate (HCC) No date: Arthritis No date: BPH (benign prostatic hyperplasia) No date: Carpal tunnel syndrome, bilateral No date: Cataract No date: Diet-controlled type 2 diabetes mellitus (HCC) No date: GERD (gastroesophageal reflux disease) No date: Hypercholesterolemia No date: Hypertension 10/2014: Left leg DVT (HCC)     Comment:  after right knee surgery had DVT in left leg No date: Morbid obesity with BMI of 40.0-44.9, adult (HCC) No date: Obstructive sleep apnea on CPAP No date: Pneumonia No date: Rectal spasm No date: Restless leg 01/2015: Right leg DVT (HCC)     Comment:  while on anticoagulation No date: RSD (reflex sympathetic dystrophy) No date: Varicose veins Past Surgical History: 2002: ANKLE FRACTURE SURGERY; Left 09/2011: ANTERIOR FUSION CERVICAL SPINE     Comment:   C4-6 06/28/2018: COLONOSCOPY WITH PROPOFOL; [name]     Comment:  Procedure: COLONOSCOPY WITH PROPOFOL;  Surgeon: [place],               Boykin Nearing, MD;  Location: [facility] ENDOSCOPY;  Service:               Gastroenterology;  Laterality: [name]; 1997: ELBOW ARTHROSCOPY; Bilateral No date: KNEE ARTHROSCOPY; Left     Comment:  [id number] 09/16/2014: KNEE ARTHROSCOPY; Right     Comment:  Procedure: ARTHROSCOPY KNEE-partial menisectomy;                Surgeon: Donato Heinz, MD;  Location: [facility] ORS;                Service: Orthopedics;  Laterality: Right; 01/29/2015: PERIPHERAL VASCULAR CATHETERIZATION; [name]     Comment:  Procedure: IVC Filter Insertion;  Surgeon: Annice Needy,               MD;  Location: [facility] INVASIVE CV LAB;  Service:               Cardiovascular;  Laterality: [name]; 1960: TONSILLECTOMY 03/06/2008: UPPER GASTROINTESTINAL ENDOSCOPY 1990: VASECTOMY 06/15/2016: WRIST ARTHROSCOPY WITH CARPOMETACARPEL ([facility])  ARTHROPLASTY; Right BMI    Body Mass Index: 41.20 kg/m     Reproductive/Obstetrics negative OB ROS                             Anesthesia Physical Anesthesia Plan  ASA: 3  Anesthesia Plan: General ETT   Post-op Pain Management:    Induction:   PONV Risk Score and Plan: 3  Airway Management Planned:  Additional Equipment:   Intra-op Plan:   Post-operative Plan:   Informed Consent: I have reviewed the patients History and Physical, chart, labs and discussed the procedure including the risks, benefits and alternatives for the proposed anesthesia with the patient or authorized representative who has indicated his/her understanding and acceptance.     Dental Advisory Given  Plan Discussed with: CRNA  Anesthesia Plan Comments:        Anesthesia Quick Evaluation

## 2023-02-08 NOTE — Anesthesia Procedure Notes (Signed)
Procedure Name: Intubation Date/Time: 02/08/2023 7:37 AM  Performed by: Rodney Booze, CRNAPre-anesthesia Checklist: Patient identified, Emergency Drugs available, Suction available and Patient being monitored Patient Re-evaluated:Patient Re-evaluated prior to induction Oxygen Delivery Method: Circle system utilized Preoxygenation: Pre-oxygenation with 100% oxygen Induction Type: IV induction Ventilation: Mask ventilation without difficulty Laryngoscope Size: McGraph and 3 Grade View: Grade I Tube type: Oral Tube size: 7.5 mm Number of attempts: 1 Airway Equipment and Method: Stylet and Oral airway Placement Confirmation: ETT inserted through vocal cords under direct vision, positive ETCO2 and breath sounds checked- equal and bilateral Secured at: 21 cm Tube secured with: Tape Dental Injury: Teeth and Oropharynx as per pre-operative assessment

## 2023-02-08 NOTE — Progress Notes (Signed)
Radiation Oncology Follow up Note  Name: Ruben [name]   Date:   01/05/2023 MRN:  [id number] DOB: 29-Dec-1955    This 67 y.o. male presents to the OR for I-125 interstitial implant in a patient with known stage IIc Gleason 7 (4+3) adenocarcinoma the prostate  REFERRING PROVIDER: No ref. provider found  HPI:  Patient is a 67 year old male who presents with a slightly elevated PSA of 4.38 back in February 2024.  MRI scan showed PI-RADS category 4 lesion bilaterally with no evidence of trans capsular spread or seminal vesicle involvement.  1 out of 12 cores was positive for Gleason 7 (4+3) adenocarcinoma.  He opted for I-125 interstitial implant and is taken to the OR today for that.  He is otherwise doing well with minimal complaints. .  COMPLICATIONS OF TREATMENT: none  FOLLOW UP COMPLIANCE: keeps appointments   PHYSICAL EXAM:  BP 132/75   Pulse 60   Temp 98 F (36.7 C) (Temporal)   Resp 18   Ht 5\' 4"  (1.626 m)   Wt 240 lb (108.9 kg)   SpO2 94%   BMI 41.20 kg/m  Well-developed well-nourished patient in NAD. HEENT reveals PERLA, EOMI, discs not visualized.  Oral cavity is clear. No oral mucosal lesions are identified. Neck is clear without evidence of cervical or supraclavicular adenopathy. Lungs are clear to A&P. Cardiac examination is essentially unremarkable with regular rate and rhythm without murmur rub or thrill. Abdomen is benign with no organomegaly or masses noted. Motor sensory and DTR levels are equal and symmetric in the upper and lower extremities. Cranial nerves II through XII are grossly intact. Proprioception is intact. No peripheral adenopathy or edema is identified. No motor or sensory levels are noted. Crude visual fields are within normal range.  RADIOLOGY RESULTS: Ultrasound used for source placement  PLAN: Patient was taken to the operating room and general anesthesia was administered. Legs were immobilized in stirrups and patient was positioned in the exact  same proportions as original volume study. Patient was prepped and Foley catheter was placed. Ultrasound guidance identified the prostate and recreated the original set up as per treatment planning volume study.  A needle grid was attached to the ultrasound probe to position the needles.  24 needles were placed under ultrasound guidance  to the  prostate PTV  . After completion of procedure cystoscopy was performed by urology and no evidence of seeds in the bladder were noted. Patient tolerated the procedure extremely well. Initial plain film as doublecheck identified 64 seeds in the prostate. Patient has followup appointment in one month for CT scan for quality assurance will be performed.Prior to implant 10% or 10 loose seeds which is ever higher were ordered and assayed to verify source strength.     Carmina Miller, MD

## 2023-02-09 NOTE — Anesthesia Postprocedure Evaluation (Signed)
Anesthesia Post Note  Patient: Ruben [name]  Procedure(s) Performed: RADIOACTIVE SEED IMPLANT/BRACHYTHERAPY IMPLANT (64 SEEDS) (Perineum)  Patient location during evaluation: PACU Anesthesia Type: General Level of consciousness: awake and alert Pain management: pain level controlled Vital Signs Assessment: post-procedure vital signs reviewed and stable Respiratory status: spontaneous breathing, nonlabored ventilation, respiratory function stable and patient connected to nasal cannula oxygen Cardiovascular status: blood pressure returned to baseline and stable Postop Assessment: no apparent nausea or vomiting Anesthetic complications: no   No notable events documented.   Last Vitals:  Vitals:   02/08/23 0915 02/08/23 0927  BP: 128/82 132/75  Pulse: 60 60  Resp: (!) 8 18  Temp:  36.7 C  SpO2: 98% 94%    Last Pain:  Vitals:   02/09/23 0831  TempSrc:   PainSc: 0-No pain                 Yevette Edwards

## 2023-02-24 ENCOUNTER — Telehealth: Payer: Self-pay

## 2023-02-24 ENCOUNTER — Other Ambulatory Visit: Payer: Medicare Other

## 2023-02-24 DIAGNOSIS — R8279 Other abnormal findings on microbiological examination of urine: Secondary | ICD-10-CM

## 2023-02-24 NOTE — Telephone Encounter (Signed)
Notified patient as instructed, patient will come to office today and leave a sample.

## 2023-02-24 NOTE — Addendum Note (Signed)
Addended by: Levada Schilling on: 02/24/2023 01:13 PM   Modules accepted: Orders

## 2023-02-24 NOTE — Telephone Encounter (Signed)
Pt LM on triage line stating that he had surgery on 02/08/23 he noticed this morning some blood from the penis and some tenderness. He also c/o dysuria. No fever or chills. He questions if this is normal at 2wks post op. Please advise.

## 2023-02-24 NOTE — Telephone Encounter (Signed)
The symptoms are not uncommon 2 weeks postop but recommend he come in for a urinalysis and culture if UA abnormal

## 2023-02-25 LAB — URINALYSIS, COMPLETE
Bilirubin, UA: NEGATIVE
Glucose, UA: NEGATIVE
Ketones, UA: NEGATIVE
Leukocytes,UA: NEGATIVE
Nitrite, UA: NEGATIVE
Protein,UA: NEGATIVE
RBC, UA: NEGATIVE
Specific Gravity, UA: 1.01 (ref 1.005–1.030)
Urobilinogen, Ur: 0.2 mg/dL (ref 0.2–1.0)
pH, UA: 7 (ref 5.0–7.5)

## 2023-02-25 LAB — MICROSCOPIC EXAMINATION

## 2023-02-27 LAB — CULTURE, URINE COMPREHENSIVE

## 2023-03-08 ENCOUNTER — Encounter: Payer: Self-pay | Admitting: Radiation Oncology

## 2023-03-08 ENCOUNTER — Ambulatory Visit
Admission: RE | Admit: 2023-03-08 | Discharge: 2023-03-08 | Disposition: A | Discharge status: Home / Self Care | Payer: Medicare Other | Source: Ambulatory Visit | Attending: Radiation Oncology | Admitting: Radiation Oncology

## 2023-03-08 ENCOUNTER — Ambulatory Visit
Admission: RE | Admit: 2023-03-08 | Discharge: 2023-03-08 | Discharge status: Home / Self Care | Payer: Medicare Other | Source: Ambulatory Visit | Attending: Radiation Oncology | Admitting: Radiation Oncology

## 2023-03-08 VITALS — BP 135/92 | HR 68 | Resp 16 | Ht 64.0 in | Wt 249.0 lb

## 2023-03-08 DIAGNOSIS — C61 Malignant neoplasm of prostate: Secondary | ICD-10-CM | POA: Diagnosis present

## 2023-03-08 NOTE — Progress Notes (Signed)
Radiation Oncology Follow up Note  Name: Ruben [name]   Date:   03/08/2023 MRN:  [id number] DOB: May 15, 1955    This 67 y.o. male presents to the clinic today for 1 month follow-up status post I-125 interstitial implant patient with known stage IIc Gleason 7 (4+3) adenocarcinoma the prostate.  REFERRING PROVIDER: Marina Goodell, MD  HPI:The patient, a 67 year old with stage two C Gleason seven adenocarcinoma of the prostate, recently underwent I-125 interstitial implant. He reports prolonged post-operative soreness in the pelvic region, more than anticipated. He also reports experiencing hot flashes, likely a side effect of the hormone therapy he is on. He has been taking calcium and a three-in-one mix to manage these symptoms, but has not yet tried vitamin E, which is suggested to help with the hot flashes.  In addition to the hot flashes, the patient reports insomnia, a pre-existing condition that may have been exacerbated by the hormone therapy. He has been taking Ambien for this condition, but it does not seem to be fully effective. He has tried Benadryl in the past, but it had an adverse effect, causing him to feel "jacked up."  COMPLICATIONS OF TREATMENT: none  FOLLOW UP COMPLIANCE: keeps appointments   PHYSICAL EXAM:  BP (!) 135/92   Pulse 68   Resp 16   Ht 5\' 4"  (1.626 m)   Wt 249 lb (112.9 kg)   BMI 42.74 kg/m  Well-developed well-nourished patient in NAD. HEENT reveals PERLA, EOMI, discs not visualized.  Oral cavity is clear. No oral mucosal lesions are identified. Neck is clear without evidence of cervical or supraclavicular adenopathy. Lungs are clear to A&P. Cardiac examination is essentially unremarkable with regular rate and rhythm without murmur rub or thrill. Abdomen is benign with no organomegaly or masses noted. Motor sensory and DTR levels are equal and symmetric in the upper and lower extremities. Cranial nerves II through XII are grossly intact.  Proprioception is intact. No peripheral adenopathy or edema is identified. No motor or sensory levels are noted. Crude visual fields are within normal range.  RADIOLOGY RESULTS: CT scan for quality assurance was performed showing excellent source placement  PLAN: Prostate Adenocarcinoma (Stage 2C Gleason 7) Post I-125 interstitial implant. No urinary or bowel complaints. -Continue current management. -Check PSA in 3 months.  Call if any concerns    Carmina Miller, MD

## 2023-03-09 DIAGNOSIS — C61 Malignant neoplasm of prostate: Secondary | ICD-10-CM | POA: Diagnosis not present

## 2023-03-10 DIAGNOSIS — C61 Malignant neoplasm of prostate: Secondary | ICD-10-CM | POA: Diagnosis not present

## 2023-03-11 ENCOUNTER — Ambulatory Visit (INDEPENDENT_AMBULATORY_CARE_PROVIDER_SITE_OTHER): Payer: Medicare Other | Admitting: Physician Assistant

## 2023-03-11 ENCOUNTER — Encounter: Payer: Self-pay | Admitting: Physician Assistant

## 2023-03-11 VITALS — BP 138/86 | HR 73 | Ht 64.0 in | Wt 248.0 lb

## 2023-03-11 DIAGNOSIS — Z8546 Personal history of malignant neoplasm of prostate: Secondary | ICD-10-CM

## 2023-03-11 DIAGNOSIS — Z08 Encounter for follow-up examination after completed treatment for malignant neoplasm: Secondary | ICD-10-CM | POA: Diagnosis not present

## 2023-03-11 DIAGNOSIS — C61 Malignant neoplasm of prostate: Secondary | ICD-10-CM

## 2023-03-11 LAB — BLADDER SCAN AMB NON-IMAGING: PVR: 22 WU

## 2023-03-11 LAB — URINALYSIS, COMPLETE
Bilirubin, UA: NEGATIVE
Glucose, UA: NEGATIVE
Ketones, UA: NEGATIVE
Leukocytes,UA: NEGATIVE
Nitrite, UA: NEGATIVE
Protein,UA: NEGATIVE
RBC, UA: NEGATIVE
Specific Gravity, UA: >1.03 — ABNORMAL HIGH (ref 1.005–1.030)
Urobilinogen, Ur: 0.2 mg/dL (ref 0.2–1.0)
pH, UA: 5 (ref 5.0–7.5)

## 2023-03-11 LAB — MICROSCOPIC EXAMINATION

## 2023-03-11 NOTE — Progress Notes (Signed)
03/11/2023 9:42 AM   Ruben [name] 10-Jan-1956 [id number]  CC: Chief Complaint  Patient presents with   Elevated PSA   HPI: Ruben [name] is a 67 y.o. male with unfavorable intermediate risk prostate cancer on ADT who underwent brachytherapy with Dr. Lonna Cobb on 02/08/2023 who presents today for postop follow-up.   Today he reports slightly increased frequency and urgency since the procedure, however this is not particularly bothersome for him and he declines pharmacotherapy.  He has noticed hot flashes and insomnia, and these have been more bothersome for him.  He started vitamin E yesterday and is considering black cohosh.  In regard to his insomnia, he has had difficulty falling asleep but not staying asleep.  He is already on Ambien per his PCP.  In-office UA and microscopy today pan negative. PVR 22mL.  PMH: Past Medical History:  Diagnosis Date   Adenocarcinoma of prostate (HCC)    Arthritis    BPH (benign prostatic hyperplasia)    Carpal tunnel syndrome, bilateral    Cataract    Diet-controlled type 2 diabetes mellitus (HCC)    GERD (gastroesophageal reflux disease)    Hypercholesterolemia    Hypertension    Left leg DVT (HCC) 10/2014   after right knee surgery had DVT in left leg   Morbid obesity with BMI of 40.0-44.9, adult (HCC)    Obstructive sleep apnea on CPAP    Pneumonia    Rectal spasm    Restless leg    Right leg DVT (HCC) 01/2015   while on anticoagulation   RSD (reflex sympathetic dystrophy)    Varicose veins     Surgical History: Past Surgical History:  Procedure Laterality Date   ANKLE FRACTURE SURGERY Left 2002   ANTERIOR FUSION CERVICAL SPINE  09/2011   C4-6   COLONOSCOPY WITH PROPOFOL [name] 06/28/2018   Procedure: COLONOSCOPY WITH PROPOFOL;  Surgeon: Toledo, Boykin Nearing, MD;  Location: [facility];  Service: Gastroenterology;  Laterality: [name];   ELBOW ARTHROSCOPY Bilateral 1997   KNEE ARTHROSCOPY Left    [id number]   KNEE  ARTHROSCOPY Right 09/16/2014   Procedure: ARTHROSCOPY KNEE-partial menisectomy;  Surgeon: Donato Heinz, MD;  Location: [facility];  Service: Orthopedics;  Laterality: Right;   PERIPHERAL VASCULAR CATHETERIZATION [name] 01/29/2015   Procedure: IVC Filter Insertion;  Surgeon: Annice Needy, MD;  Location: [facility];  Service: Cardiovascular;  Laterality: [name];   RADIOACTIVE SEED IMPLANT [name] 02/08/2023   Procedure: RADIOACTIVE SEED IMPLANT/BRACHYTHERAPY IMPLANT (64 SEEDS);  Surgeon: [name], MD;  Location: [facility];  Service: Urology;  Laterality: [name];  64 seeds   TONSILLECTOMY  1960   UPPER GASTROINTESTINAL ENDOSCOPY  03/06/2008   VASECTOMY  1990   WRIST ARTHROSCOPY WITH CARPOMETACARPEL ([facility]) ARTHROPLASTY Right 06/15/2016    Home Medications:  Allergies as of 03/11/2023       Reactions   Aleve [naproxen] Rash   Rectal spasms   Diclofenac Other (See Comments)   Rectal pain   Diclofenac-misoprostol Rash   Ibuprofen Rash   Rectal spasm    Lodine [etodolac] Rash   Turmeric Rash        Medication List        Accurate as of March 11, 2023  9:42 AM. If you have any questions, ask your nurse or doctor.          amLODipine 2.5 MG tablet Commonly known as: NORVASC Take 2.5 mg by mouth daily.   aspirin EC 81 MG tablet Take by  mouth.   baclofen 10 MG tablet Commonly known as: LIORESAL Take 10 mg by mouth daily after supper.   celecoxib 200 MG capsule Commonly known as: CELEBREX Take 1 capsule by mouth 2 (two) times daily.   HYDROcodone-acetaminophen 5-325 MG tablet Commonly known as: NORCO/VICODIN Take 1 tablet by mouth every 6 (six) hours as needed for moderate pain.   hyoscyamine 0.125 MG Tbdp disintergrating tablet Commonly known as: ANASPAZ Place 0.125 mg under the tongue every 4 (four) hours as needed (rectal spasm).   losartan-hydrochlorothiazide 100-25 MG tablet Commonly known as: HYZAAR Take 1 tablet by mouth daily.   Melatonin 3 MG Caps Take  1 capsule by mouth at bedtime as needed. Reported on 05/22/2015   mometasone 0.1 % lotion Commonly known as: ELOCON Apply topically.   pantoprazole 40 MG tablet Commonly known as: PROTONIX Take 1 tablet by mouth daily.   pregabalin 50 MG capsule Commonly known as: LYRICA Take 50 mg by mouth 2 (two) times daily.   rosuvastatin 5 MG tablet Commonly known as: CRESTOR Take 1 tablet by mouth at bedtime.   tamsulosin 0.4 MG Caps capsule Commonly known as: FLOMAX Take 0.4 mg by mouth daily.   zolpidem 10 MG tablet Commonly known as: AMBIEN Take 10 mg by mouth at bedtime as needed for sleep.        Allergies:  Allergies  Allergen Reactions   Aleve [Naproxen] Rash    Rectal spasms   Diclofenac Other (See Comments)    Rectal pain   Diclofenac-Misoprostol Rash   Ibuprofen Rash    Rectal spasm    Lodine [Etodolac] Rash   Turmeric Rash    Family History: Family History  Problem Relation Age of Onset   Lung cancer Mother 53       with brain mets   Liver cancer Father 84       hepatocellular cancer   Other Father 53       carcinogeneticsarcoma of the mouth   Myelodysplastic syndrome Paternal Uncle    Deep vein thrombosis Paternal Uncle        dvt after surgery   Heart disease Other    Hypertension Other    Liver disease Other     Social History:   reports that he has quit smoking. His smoking use included cigars. He has quit using smokeless tobacco. He reports current alcohol use of about 1.0 standard drink of alcohol per week. He reports that he does not use drugs.  Physical Exam: BP 138/86   Pulse 73   Ht 5\' 4"  (1.626 m)   Wt 248 lb (112.5 kg)   BMI 42.57 kg/m   Constitutional:  Alert and oriented, no acute distress, nontoxic appearing HEENT: Ruben [name], AT Cardiovascular: No clubbing, cyanosis, or edema Respiratory: Normal respiratory effort, no increased work of breathing Skin: No rashes, bruises or suspicious lesions Neurologic: Grossly intact, no focal  deficits, moving all 4 extremities Psychiatric: Normal mood and affect  Laboratory Data: Results for orders placed or performed in visit on 03/11/23  Microscopic Examination   Urine  Result Value Ref Range   WBC, UA 0-5 0 - 5 /hpf   RBC, Urine 0-2 0 - 2 /hpf   Epithelial Cells (non renal) 0-10 0 - 10 /hpf   Mucus, UA Present (A) Not Estab.   Bacteria, UA Few None seen/Few  Urinalysis, Complete  Result Value Ref Range   Specific Gravity, UA >1.030 (H) 1.005 - 1.030   pH, UA 5.0 5.0 -  7.5   Color, UA Yellow Yellow   Appearance Ur Clear Clear   Leukocytes,UA Negative Negative   Protein,UA Negative Negative/Trace   Glucose, UA Negative Negative   Ketones, UA Negative Negative   RBC, UA Negative Negative   Bilirubin, UA Negative Negative   Urobilinogen, Ur 0.2 0.2 - 1.0 mg/dL   Nitrite, UA Negative Negative   Microscopic Examination See below:   Bladder Scan (Post Void Residual) in office  Result Value Ref Range   PVR 22.0 WU   Assessment & Plan:   1. Prostate cancer (HCC) Minimal symptom burden after undergoing brachytherapy last month.  Will defer pharmacotherapy for urgency/frequency.  Agree with vitamin E, black cohosh for hot flashes.  I encouraged him to maintain good sleep hygiene including limiting screens in the evening and follow-up with his PCP regarding difficulty falling asleep. - Urinalysis, Complete - Bladder Scan (Post Void Residual) in office   Return in about 4 months (around 07/09/2023) for Follow up with Dr. Lonna Cobb.  Carman Ching, PA-C  [facility] [place] [address] [place], [place] [address] [phone]

## 2023-06-13 ENCOUNTER — Inpatient Hospital Stay: Payer: Medicare Other | Attending: Radiation Oncology

## 2023-06-13 DIAGNOSIS — C61 Malignant neoplasm of prostate: Secondary | ICD-10-CM | POA: Insufficient documentation

## 2023-06-13 LAB — PSA: Prostatic Specific Antigen: 0.01 ng/mL (ref 0.00–4.00)

## 2023-06-20 ENCOUNTER — Encounter: Payer: Self-pay | Admitting: Radiation Oncology

## 2023-06-20 ENCOUNTER — Other Ambulatory Visit: Payer: Self-pay | Admitting: *Deleted

## 2023-06-20 ENCOUNTER — Ambulatory Visit
Admission: RE | Admit: 2023-06-20 | Discharge: 2023-06-20 | Disposition: A | Discharge status: Home / Self Care | Payer: Medicare Other | Source: Ambulatory Visit | Attending: Radiation Oncology | Admitting: Radiation Oncology

## 2023-06-20 VITALS — BP 160/75 | HR 79 | Temp 98.4°F | Resp 20 | Wt 253.0 lb

## 2023-06-20 DIAGNOSIS — C61 Malignant neoplasm of prostate: Secondary | ICD-10-CM | POA: Insufficient documentation

## 2023-06-20 NOTE — Progress Notes (Signed)
 Radiation Oncology Follow up Note  Name: Ruben [name]   Date:   06/20/2023 MRN:  [id number] DOB: [birth year]    This 68 y.o. male presents to the clinic today for 5-month follow-up status post I-125 interstitial implant for stage IIc Gleason 7 (4+3) adenocarcinoma the prostate.  REFERRING PROVIDER: Marina Goodell, MD  HPI: Patient is a 67 year old male now out for months having completed I-125 interstitial implant for Gleason 7 adenocarcinoma the prostate.  Seen today in routine follow-up he is doing well specifically denies any increased lower urinary tract symptoms diarrhea or fatigue.  His most recent PSA this month was 0.01.[name]  COMPLICATIONS OF TREATMENT: none  FOLLOW UP COMPLIANCE: keeps appointments   PHYSICAL EXAM:  BP (!) 160/75   Pulse 79   Temp 98.4 F (36.9 C)   Resp 20   Wt 253 lb (114.8 kg)   BMI 43.43 kg/m  Well-developed well-nourished patient in NAD. HEENT reveals PERLA, EOMI, discs not visualized.  Oral cavity is clear. No oral mucosal lesions are identified. Neck is clear without evidence of cervical or supraclavicular adenopathy. Lungs are clear to A&P. Cardiac examination is essentially unremarkable with regular rate and rhythm without murmur rub or thrill. Abdomen is benign with no organomegaly or masses noted. Motor sensory and DTR levels are equal and symmetric in the upper and lower extremities. Cranial nerves II through XII are grossly intact. Proprioception is intact. No peripheral adenopathy or edema is identified. No motor or sensory levels are noted. Crude visual fields are within normal range.  RADIOLOGY RESULTS: No current films for review  PLAN: Present time patient is now 4 months out from interstitial implant.  He is doing well with excellent biochemical response to treatment.  Of asked to go out 6 months and follow-up with repeat PSA.  Patient knows to call with any concerns.  I would like to take this opportunity to thank you for allowing  me to participate in the care of your patient.Carmina Miller, MD

## 2023-07-18 ENCOUNTER — Encounter: Payer: Self-pay | Admitting: Urology

## 2023-07-18 ENCOUNTER — Ambulatory Visit (INDEPENDENT_AMBULATORY_CARE_PROVIDER_SITE_OTHER): Payer: Medicare Other | Admitting: Urology

## 2023-07-18 VITALS — BP 156/96 | HR 77 | Ht 64.0 in | Wt 254.0 lb

## 2023-07-18 DIAGNOSIS — C61 Malignant neoplasm of prostate: Secondary | ICD-10-CM | POA: Diagnosis not present

## 2023-07-18 MED ORDER — LEUPROLIDE ACETATE (6 MONTH) 45 MG ~~LOC~~ KIT
45.0000 mg | PACK | Freq: Once | SUBCUTANEOUS | Status: AC
Start: 2023-07-18 — End: 2023-07-18
  Administered 2023-07-18: 45 mg via SUBCUTANEOUS

## 2023-07-18 NOTE — Progress Notes (Deleted)
 The

## 2023-07-18 NOTE — Progress Notes (Signed)
 I, Maysun Anabel Bene, acting as a scribe for [name], MD., have documented all relevant documentation on the behalf of [name], MD, as directed by [name], MD while in the presence of [name], MD.  07/18/2023 11:20 AM   Ruben [name] [birth year] [id number]  Referring provider: Marina Goodell, MD [address] [place],  [place] [address]  Chief Complaint  Patient presents with   Prostate Cancer   Urology history: 1.  Prostate cancer NCCN unfavorable intermediate risk PSA 08/2022 5.01 MRI PI-RADS 4 bilateral posteromedial PZ mid gland MR fusion biopsy 12/01/2022; ROI bxs negative; left base Gleason 4+3 adenocarcinoma (45%) MRI/bone scan negative for extraprostatic disease Brachytherapy+ADT; procedure 02/08/23.  HPI: Ruben [name] is a 68 y.o. male presents for prostate cancer follow-up.   No complaints since last visit. No bothersome lower urinary tract symptoms  PSA 06/13/23 0.01   PMH: Past Medical History:  Diagnosis Date   Adenocarcinoma of prostate (HCC)    Arthritis    BPH (benign prostatic hyperplasia)    Carpal tunnel syndrome, bilateral    Cataract    Diet-controlled type 2 diabetes mellitus (HCC)    GERD (gastroesophageal reflux disease)    Hypercholesterolemia    Hypertension    Left leg DVT (HCC) 10/2014   after right knee surgery had DVT in left leg   Morbid obesity with BMI of 40.0-44.9, adult (HCC)    Obstructive sleep apnea on CPAP    Pneumonia    Rectal spasm    Restless leg    Right leg DVT (HCC) 01/2015   while on anticoagulation   RSD (reflex sympathetic dystrophy)    Varicose veins     Surgical History: Past Surgical History:  Procedure Laterality Date   ANKLE FRACTURE SURGERY Left 2002   ANTERIOR FUSION CERVICAL SPINE  09/2011   C4-6   COLONOSCOPY WITH PROPOFOL [name] 06/28/2018   Procedure: COLONOSCOPY WITH PROPOFOL;  Surgeon: Toledo, Boykin Nearing, MD;  Location: [facility];  Service:  Gastroenterology;  Laterality: [name];   ELBOW ARTHROSCOPY Bilateral 1997   KNEE ARTHROSCOPY Left    [id number]   KNEE ARTHROSCOPY Right 09/16/2014   Procedure: ARTHROSCOPY KNEE-partial menisectomy;  Surgeon: Donato Heinz, MD;  Location: [facility];  Service: Orthopedics;  Laterality: Right;   PERIPHERAL VASCULAR CATHETERIZATION [name] 01/29/2015   Procedure: IVC Filter Insertion;  Surgeon: Annice Needy, MD;  Location: [facility];  Service: Cardiovascular;  Laterality: [name];   RADIOACTIVE SEED IMPLANT [name] 02/08/2023   Procedure: RADIOACTIVE SEED IMPLANT/BRACHYTHERAPY IMPLANT (64 SEEDS);  Surgeon: [name], MD;  Location: [facility];  Service: Urology;  Laterality: [name];  64 seeds   TONSILLECTOMY  1960   UPPER GASTROINTESTINAL ENDOSCOPY  03/06/2008   VASECTOMY  1990   WRIST ARTHROSCOPY WITH CARPOMETACARPEL ([facility]) ARTHROPLASTY Right 06/15/2016    Home Medications:  Allergies as of 07/18/2023       Reactions   Aleve [naproxen] Rash   Rectal spasms   Diclofenac Other (See Comments)   Rectal pain   Diclofenac-misoprostol Rash   Ibuprofen Rash   Rectal spasm    Lodine [etodolac] Rash   Turmeric Rash        Medication List        Accurate as of July 18, 2023 11:20 AM. If you have any questions, ask your nurse or doctor.          amLODipine 2.5 MG tablet Commonly known as: NORVASC  Take 2.5 mg by mouth daily.   aspirin EC 81 MG tablet Take by mouth.   baclofen 10 MG tablet Commonly known as: LIORESAL Take 10 mg by mouth daily after supper.   celecoxib 200 MG capsule Commonly known as: CELEBREX Take 1 capsule by mouth 2 (two) times daily.   HYDROcodone-acetaminophen 5-325 MG tablet Commonly known as: NORCO/VICODIN Take 1 tablet by mouth every 6 (six) hours as needed for moderate pain.   hyoscyamine 0.125 MG Tbdp disintergrating tablet Commonly known as: ANASPAZ Place 0.125 mg under the tongue every 4 (four) hours as needed (rectal spasm).    losartan-hydrochlorothiazide 100-25 MG tablet Commonly known as: HYZAAR Take 1 tablet by mouth daily.   Melatonin 3 MG Caps Take 1 capsule by mouth at bedtime as needed. Reported on 05/22/2015   mometasone 0.1 % lotion Commonly known as: ELOCON Apply topically.   pantoprazole 40 MG tablet Commonly known as: PROTONIX Take 1 tablet by mouth daily.   pregabalin 50 MG capsule Commonly known as: LYRICA Take 50 mg by mouth 2 (two) times daily.   rosuvastatin 5 MG tablet Commonly known as: CRESTOR Take 1 tablet by mouth at bedtime.   tamsulosin 0.4 MG Caps capsule Commonly known as: FLOMAX Take 0.4 mg by mouth daily.   zolpidem 10 MG tablet Commonly known as: AMBIEN Take 10 mg by mouth at bedtime as needed for sleep.        Allergies:  Allergies  Allergen Reactions   Aleve [Naproxen] Rash    Rectal spasms   Diclofenac Other (See Comments)    Rectal pain   Diclofenac-Misoprostol Rash   Ibuprofen Rash    Rectal spasm    Lodine [Etodolac] Rash   Turmeric Rash    Family History: Family History  Problem Relation Age of Onset   Lung cancer Mother 24       with brain mets   Liver cancer Father 94       hepatocellular cancer   Other Father 72       carcinogeneticsarcoma of the mouth   Myelodysplastic syndrome Paternal Uncle    Deep vein thrombosis Paternal Uncle        dvt after surgery   Heart disease Other    Hypertension Other    Liver disease Other     Social History:  reports that he has quit smoking. His smoking use included cigars. He has quit using smokeless tobacco. He reports current alcohol use of about 1.0 standard drink of alcohol per week. He reports that he does not use drugs.   Physical Exam: BP (!) 156/96   Pulse 77   Ht 5\' 4"  (1.626 m)   Wt 254 lb (115.2 kg)   BMI 43.60 kg/m   Constitutional:  Alert and oriented, No acute distress. HEENT: [place] AT Respiratory: Normal respiratory effort, no increased work of breathing. Psychiatric: Normal  mood and affect.   Assessment & Plan:    1. T1c unfavorable intermediate risk prostate cancer Status post brachytherapy 6 month Leuprolide today which will be his last.  He has a follow-up appointment with radiation oncology August 2025.  Will schedule a lab visit for a PSA May 2025 and see him in the office by November 2025 with PSA.  I have reviewed the above documentation for accuracy and completeness, and I agree with the above.   [name], MD  [facility] [address] [place], [place] [address] [phone]

## 2023-07-25 ENCOUNTER — Ambulatory Visit: Payer: Medicare Other | Admitting: Urology

## 2023-09-19 ENCOUNTER — Other Ambulatory Visit

## 2023-09-20 ENCOUNTER — Other Ambulatory Visit

## 2023-09-20 DIAGNOSIS — C61 Malignant neoplasm of prostate: Secondary | ICD-10-CM

## 2023-09-21 ENCOUNTER — Ambulatory Visit: Payer: Self-pay | Admitting: Urology

## 2023-09-21 LAB — PSA: Prostate Specific Ag, Serum: <0.1 ng/mL (ref 0.0–4.0)

## 2023-12-12 ENCOUNTER — Inpatient Hospital Stay: Payer: Medicare Other | Attending: Radiation Oncology

## 2023-12-12 DIAGNOSIS — C61 Malignant neoplasm of prostate: Secondary | ICD-10-CM | POA: Insufficient documentation

## 2023-12-12 LAB — PSA: Prostatic Specific Antigen: <0.01 ng/mL (ref 0.00–4.00)

## 2023-12-19 ENCOUNTER — Ambulatory Visit: Payer: Medicare Other | Admitting: Radiation Oncology

## 2023-12-21 ENCOUNTER — Ambulatory Visit
Admission: RE | Admit: 2023-12-21 | Discharge: 2023-12-21 | Disposition: A | Discharge status: Home / Self Care | Source: Ambulatory Visit | Attending: Radiation Oncology | Admitting: Radiation Oncology

## 2023-12-21 ENCOUNTER — Other Ambulatory Visit: Payer: Self-pay | Admitting: *Deleted

## 2023-12-21 ENCOUNTER — Encounter: Payer: Self-pay | Admitting: Radiation Oncology

## 2023-12-21 VITALS — BP 135/83 | HR 76 | Temp 98.2°F | Resp 16 | Wt 251.0 lb

## 2023-12-21 DIAGNOSIS — C61 Malignant neoplasm of prostate: Secondary | ICD-10-CM | POA: Insufficient documentation

## 2023-12-21 DIAGNOSIS — Z923 Personal history of irradiation: Secondary | ICD-10-CM | POA: Insufficient documentation

## 2023-12-21 NOTE — Progress Notes (Signed)
 Radiation Oncology Follow up Note  Name: Ruben [name]   Date:   12/21/2023 MRN:  [id number] DOB: [birth year]    This 68 y.o. male presents to the clinic today for 15-month follow-up status post I-125 interstitial implant for stage IIc Gleason 7 adenocarcinoma the prostate.  REFERRING PROVIDER: Jeffie Cheryl BRAVO, MD  HPI: Patient is a 68 year old male now out 10 months having completed I-125 interstitial implant  to his prostate for Gleason 7 adenocarcinoma seen today in routine follow-up he is doing well..  He specifically denies any increased lower Neri tract symptoms diarrhea or fatigue.  His most recent PSA is less than 0.01 showing excellent biochemical result.  He is not on ADT therapy.  COMPLICATIONS OF TREATMENT: none  FOLLOW UP COMPLIANCE: keeps appointments   PHYSICAL EXAM:  BP 135/83   Pulse 76   Temp 98.2 F (36.8 C) (Tympanic)   Resp 16   Wt 251 lb (113.9 kg)   BMI 43.08 kg/m  Well-developed well-nourished patient in NAD. HEENT reveals PERLA, EOMI, discs not visualized.  Oral cavity is clear. No oral mucosal lesions are identified. Neck is clear without evidence of cervical or supraclavicular adenopathy. Lungs are clear to A&P. Cardiac examination is essentially unremarkable with regular rate and rhythm without murmur rub or thrill. Abdomen is benign with no organomegaly or masses noted. Motor sensory and DTR levels are equal and symmetric in the upper and lower extremities. Cranial nerves II through XII are grossly intact. Proprioception is intact. No peripheral adenopathy or edema is identified. No motor or sensory levels are noted. Crude visual fields are within normal range.  RADIOLOGY RESULTS: No current films to review  PLAN: Present time patient is under excellent biochemical control of his prostate cancer.  And pleased with his overall progress.  Will see him back in 6 months for follow-up with repeat PSA should that be again in the 0.01 range we will start  seeing him back once a year for follow-up.  Patient knows to call with any concerns.  I would like to take this opportunity to thank you for allowing me to participate in the care of your patient.SABRA Marcey Penton, MD

## 2024-03-19 ENCOUNTER — Other Ambulatory Visit

## 2024-03-22 ENCOUNTER — Other Ambulatory Visit

## 2024-03-22 DIAGNOSIS — C61 Malignant neoplasm of prostate: Secondary | ICD-10-CM

## 2024-03-22 NOTE — Progress Notes (Signed)
 Ruben [name], RMA TL   03/21/24 12:36 PM Note    Refill CCM time 8 mins

## 2024-03-23 LAB — PSA: Prostate Specific Ag, Serum: <0.1 ng/mL (ref 0.0–4.0)

## 2024-03-26 ENCOUNTER — Ambulatory Visit (INDEPENDENT_AMBULATORY_CARE_PROVIDER_SITE_OTHER): Admitting: Urology

## 2024-03-26 ENCOUNTER — Encounter: Payer: Self-pay | Admitting: Urology

## 2024-03-26 VITALS — BP 143/85 | HR 76 | Ht 64.0 in | Wt 244.0 lb

## 2024-03-26 DIAGNOSIS — C61 Malignant neoplasm of prostate: Secondary | ICD-10-CM | POA: Diagnosis not present

## 2024-03-26 NOTE — Progress Notes (Signed)
 03/26/2024 8:46 AM   Ruben [name], Ruben [name] [id number]  Referring provider: Jeffie Cheryl BRAVO, MD [address] [place],  [place] [address]  Chief Complaint  Patient presents with   Prostate Cancer   Urology history: 1.  Prostate cancer NCCN unfavorable intermediate risk PSA 08/2022 5.01 MRI PI-RADS 4 bilateral posteromedial PZ mid gland MR fusion biopsy 12/01/2022; ROI bxs negative; left base Gleason 4+3 adenocarcinoma (45%) MRI/bone scan negative for extraprostatic disease Brachytherapy+ADT; procedure 02/08/23. ADT completed 07/18/2023  HPI: Ruben [name] is a 68 y.o. male who presents for a 26-month follow-up.  No significant changes since last year's visit No bothersome LUTS Notes occasional perineal discomfort PSA 03/22/2024 remains undetectable at < 0.1  PSA trend    Prostate Specific Ag, Serum  Latest Ref Rng 0.0 - 4.0 ng/mL  12/09/2020 2.56  06/29/2022 4.38  08/19/2022 5.01  06/13/2023 0.01  09/20/2023 < 0.1  12/12/2023 < 0.1  03/22/2024 < 0.1    PMH: Past Medical History:  Diagnosis Date   Adenocarcinoma of prostate (HCC)    Arthritis    BPH (benign prostatic hyperplasia)    Carpal tunnel syndrome, bilateral    Cataract    Diet-controlled type 2 diabetes mellitus (HCC)    GERD (gastroesophageal reflux disease)    Hypercholesterolemia    Hypertension    Left leg DVT (HCC) 10/2014   after right knee surgery had DVT in left leg   Morbid obesity with BMI of 40.0-44.9, adult (HCC)    Obstructive sleep apnea on CPAP    Pneumonia    Rectal spasm    Restless leg    Right leg DVT (HCC) 01/2015   while on anticoagulation   RSD (reflex sympathetic dystrophy)    Varicose veins     Surgical History: Past Surgical History:  Procedure Laterality Date   ANKLE FRACTURE SURGERY Left 2002   ANTERIOR FUSION CERVICAL SPINE  [name]/2013   C4-6   COLONOSCOPY WITH PROPOFOL  [name] 06/28/2018   Procedure: COLONOSCOPY WITH PROPOFOL ;  Surgeon: Toledo, Ladell POUR,  MD;  Location: [facility];  Service: Gastroenterology;  Laterality: [name];   ELBOW ARTHROSCOPY Bilateral 1997   KNEE ARTHROSCOPY Left    [id number]   KNEE ARTHROSCOPY Right [name]/16/2016   Procedure: ARTHROSCOPY KNEE-partial menisectomy;  Surgeon: Lynwood SHAUNNA Hue, MD;  Location: [facility];  Service: Orthopedics;  Laterality: Right;   PERIPHERAL VASCULAR CATHETERIZATION [name] 01/29/2015   Procedure: IVC Filter Insertion;  Surgeon: Selinda GORMAN Gu, MD;  Location: [facility];  Service: Cardiovascular;  Laterality: [name];   RADIOACTIVE SEED IMPLANT [name] 02/08/2023   Procedure: RADIOACTIVE SEED IMPLANT/BRACHYTHERAPY IMPLANT (64 SEEDS);  Surgeon: Twylla Glendia BROCKS, MD;  Location: [facility];  Service: Urology;  Laterality: [name];  64 seeds   TONSILLECTOMY  1960   UPPER GASTROINTESTINAL ENDOSCOPY  03/06/2008   VASECTOMY  1990   WRIST ARTHROSCOPY WITH CARPOMETACARPEL ([facility]) ARTHROPLASTY Right 06/15/2016    Home Medications:  Allergies as of 03/26/2024       Reactions   Aleve [naproxen] Rash   Rectal spasms   Diclofenac Other (See Comments)   Rectal pain   Diclofenac-misoprostol Rash   Ibuprofen Rash   Rectal spasm    Lodine [etodolac] Rash   Turmeric Rash        Medication List        Accurate as of March 26, 2024  8:46 AM. If you have any questions, ask your nurse or doctor.  STOP taking these medications    Melatonin 3 MG Caps Stopped by: Glendia BROCKS Onis Markoff       TAKE these medications    amLODipine 2.5 MG tablet Commonly known as: NORVASC Take 2.5 mg by mouth daily.   aspirin EC 81 MG tablet Take by mouth.   baclofen 10 MG tablet Commonly known as: LIORESAL Take 10 mg by mouth daily after supper.   celecoxib 200 MG capsule Commonly known as: CELEBREX Take 1 capsule by mouth 2 (two) times daily.   HYDROcodone -acetaminophen  5-325 MG tablet Commonly known as: NORCO/VICODIN Take 1 tablet by mouth every 6 (six) hours as needed for moderate pain.    hyoscyamine 0.125 MG Tbdp disintergrating tablet Commonly known as: ANASPAZ Place 0.125 mg under the tongue every 4 (four) hours as needed (rectal spasm).   losartan-hydrochlorothiazide 100-25 MG tablet Commonly known as: HYZAAR Take 1 tablet by mouth daily.   mometasone 0.1 % lotion Commonly known as: ELOCON Apply topically.   pantoprazole 40 MG tablet Commonly known as: PROTONIX Take 1 tablet by mouth daily.   pregabalin 50 MG capsule Commonly known as: LYRICA Take 50 mg by mouth 2 (two) times daily.   rosuvastatin 5 MG tablet Commonly known as: CRESTOR Take 1 tablet by mouth at bedtime.   tamsulosin 0.4 MG Caps capsule Commonly known as: FLOMAX Take 0.4 mg by mouth daily.   zolpidem 10 MG tablet Commonly known as: AMBIEN Take 10 mg by mouth at bedtime as needed for sleep.        Allergies:  Allergies  Allergen Reactions   Aleve [Naproxen] Rash    Rectal spasms   Diclofenac Other (See Comments)    Rectal pain   Diclofenac-Misoprostol Rash   Ibuprofen Rash    Rectal spasm    Lodine [Etodolac] Rash   Turmeric Rash    Family History: Family History  Problem Relation Age of Onset   Lung cancer Mother 79       with brain mets   Liver cancer Father 63       hepatocellular cancer   Other Father 69       carcinogeneticsarcoma of the mouth   Myelodysplastic syndrome Paternal Uncle    Deep vein thrombosis Paternal Uncle        dvt after surgery   Heart disease Other    Hypertension Other    Liver disease Other     Social History:  reports that he has quit smoking. His smoking use included cigars. He has quit using smokeless tobacco. He reports current alcohol use of about 1.0 standard drink of alcohol per week. He reports that he does not use drugs.   Physical Exam: BP (!) 143/85   Pulse 76   Ht 5' 4 (1.626 m)   Wt 244 lb (110.7 kg)   BMI 41.88 kg/m   Constitutional:  Alert, No acute distress. HEENT: [place] AT Respiratory: Normal respiratory  effort, no increased work of breathing. Psychiatric: Normal mood and affect.   Assessment & Plan:    1. T1c unfavorable intermediate risk prostate cancer  Undetectable PSA Scheduled to see radiation oncology February 2026 and we will see him back August 2026 with a PSA   Glendia BROCKS Barba, MD  [facility] [address], [address] [place], [place] [address] [phone]

## 2024-06-14 ENCOUNTER — Other Ambulatory Visit

## 2024-06-21 ENCOUNTER — Ambulatory Visit: Admitting: Radiation Oncology

## 2024-12-18 ENCOUNTER — Other Ambulatory Visit

## 2024-12-25 ENCOUNTER — Ambulatory Visit: Admitting: Urology
